# Patient Record
Sex: Female | Born: 1995 | Race: White | Hispanic: No | Marital: Single | Smoking: Never smoker
Health system: Southern US, Community
[De-identification: ages and names within clinical notes are randomized; demographics above are authoritative.]

## PROBLEM LIST (undated history)

## (undated) DIAGNOSIS — F909 Attention-deficit hyperactivity disorder, unspecified type: Secondary | ICD-10-CM

## (undated) DIAGNOSIS — N390 Urinary tract infection, site not specified: Secondary | ICD-10-CM

## (undated) HISTORY — DX: Urinary tract infection, site not specified: N39.0

## (undated) HISTORY — PX: CYSTOSCOPY: SUR368

## (undated) HISTORY — DX: Attention-deficit hyperactivity disorder, unspecified type: F90.9

---
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
## 2015-01-06 ENCOUNTER — Telehealth: Payer: Self-pay | Admitting: Family Medicine

## 2015-01-06 MED ORDER — AMPHETAMINE-DEXTROAMPHET ER 30 MG PO CP24: 30.0000 mg | ORAL_CAPSULE | Freq: Two times a day (BID) | ORAL | Status: DC

## 2015-01-06 NOTE — Telephone Encounter (Signed)
done

## 2015-01-06 NOTE — Telephone Encounter (Signed)
Refill request for Adderall XR, mom would like to pick up today.

## 2015-01-06 NOTE — Telephone Encounter (Signed)
Script is ready for pick up and I left a voice message on mom's cell number. 

## 2015-01-19 ENCOUNTER — Ambulatory Visit: Payer: 59 | Admitting: Family Medicine

## 2015-03-17 ENCOUNTER — Ambulatory Visit: Payer: Self-pay | Admitting: Medical

## 2015-03-17 ENCOUNTER — Telehealth: Payer: Self-pay

## 2015-03-17 NOTE — Telephone Encounter (Signed)
This patient no showed for their appointment today.Which of the following is necessary for this patient.   A) No follow-up necessary   B) Follow-up urgent. Locate Patient Immediately.   C) Follow-up necessary. Contact patient and Schedule visit in ____ Days.   D) Follow-up Advised. Contact patient and Schedule visit in ____ Days. 

## 2015-03-17 NOTE — Telephone Encounter (Signed)
No show new patient = no chance to come back

## 2015-03-19 ENCOUNTER — Ambulatory Visit: Payer: 59 | Admitting: Family Medicine

## 2015-03-19 ENCOUNTER — Telehealth: Payer: Self-pay | Admitting: Family Medicine

## 2015-03-19 NOTE — Telephone Encounter (Signed)
Pt was on schedule to see Dr. Sarajane Jews today, I called and left a voice message for pt to reschedule.

## 2015-04-05 ENCOUNTER — Telehealth: Payer: Self-pay | Admitting: Family Medicine

## 2015-04-05 MED ORDER — AMPHETAMINE-DEXTROAMPHET ER 30 MG PO CP24: 30.0000 mg | ORAL_CAPSULE | Freq: Two times a day (BID) | ORAL | Status: DC

## 2015-04-05 NOTE — Telephone Encounter (Signed)
Refill request for Adderall XR, pt will schedule a office visit for May, this is when she gets out of school. Jessica Lucero did apologize for last missed appointment, since she did the scheduling.

## 2015-04-05 NOTE — Telephone Encounter (Signed)
done

## 2015-04-06 NOTE — Telephone Encounter (Signed)
Rx is ready for pick up. Voicemail was left fr the pt

## 2015-07-01 ENCOUNTER — Telehealth: Payer: Self-pay | Admitting: Family Medicine

## 2015-07-01 MED ORDER — AMPHETAMINE-DEXTROAMPHET ER 30 MG PO CP24: 30.0000 mg | ORAL_CAPSULE | Freq: Two times a day (BID) | ORAL | Status: DC

## 2015-07-01 MED ORDER — SUMATRIPTAN SUCCINATE 100 MG PO TABS: ORAL_TABLET | ORAL | Status: DC

## 2015-07-01 NOTE — Telephone Encounter (Signed)
Refill request for Adderall & Imitrex, please call mom's cell number when ready.

## 2015-07-01 NOTE — Telephone Encounter (Signed)
Script for Adderall is ready for pick up, sent Imitrex e-scribe and left a voice message on mom's cell number.

## 2015-07-01 NOTE — Telephone Encounter (Signed)
Adderall was written. Also refill Imitrex for one year

## 2015-09-27 ENCOUNTER — Telehealth: Payer: Self-pay | Admitting: Family Medicine

## 2015-09-27 NOTE — Telephone Encounter (Signed)
Refill request for Adderall and call Nicole's (mom) cell number when ready.

## 2015-09-28 MED ORDER — AMPHETAMINE-DEXTROAMPHET ER 30 MG PO CP24: 30.0000 mg | ORAL_CAPSULE | Freq: Two times a day (BID) | ORAL | 0 refills | Status: DC

## 2015-09-28 NOTE — Telephone Encounter (Signed)
Written for 3 months, but she needs an OV as soon as she is available

## 2015-09-28 NOTE — Telephone Encounter (Signed)
Left message for patient to call back. Needs office visit as soon as she is available. Rx's are up front for pick up.

## 2015-09-28 NOTE — Telephone Encounter (Signed)
Left message on Mom's cell phone that Rx's are ready and when she comes by to please make appointment for patient to follow up on her meds.

## 2015-10-27 ENCOUNTER — Ambulatory Visit: Payer: 59 | Admitting: Family Medicine

## 2016-10-20 ENCOUNTER — Ambulatory Visit (INDEPENDENT_AMBULATORY_CARE_PROVIDER_SITE_OTHER): Payer: 59 | Admitting: Family Medicine

## 2016-10-20 ENCOUNTER — Encounter: Payer: Self-pay | Admitting: Family Medicine

## 2016-10-20 VITALS — BP 108/69 | HR 82 | Temp 98.8°F | Ht 63.0 in | Wt 155.0 lb

## 2016-10-20 DIAGNOSIS — L700 Acne vulgaris: Secondary | ICD-10-CM

## 2016-10-20 DIAGNOSIS — F900 Attention-deficit hyperactivity disorder, predominantly inattentive type: Secondary | ICD-10-CM

## 2016-10-20 DIAGNOSIS — N62 Hypertrophy of breast: Secondary | ICD-10-CM | POA: Diagnosis not present

## 2016-10-20 MED ORDER — ATOMOXETINE HCL 40 MG PO CAPS: 40.0000 mg | ORAL_CAPSULE | Freq: Every day | ORAL | 2 refills | Status: DC

## 2016-10-20 MED ORDER — TRETINOIN 0.05 % EX CREA: TOPICAL_CREAM | Freq: Every day | CUTANEOUS | 2 refills | Status: DC

## 2016-10-20 NOTE — Patient Instructions (Signed)
WE NOW OFFER   Jessica Lucero's FAST TRACK!!!  SAME DAY Appointments for ACUTE CARE  Such as: Sprains, Injuries, cuts, abrasions, rashes, muscle pain, joint pain, back pain Colds, flu, sore throats, headache, allergies, cough, fever  Ear pain, sinus and eye infections Abdominal pain, nausea, vomiting, diarrhea, upset stomach Animal/insect bites  3 Easy Ways to Schedule: Walk-In Scheduling Call in scheduling Mychart Sign-up: [email or web address]         

## 2016-10-20 NOTE — Progress Notes (Signed)
   Subjective:    Patient ID: Jessica Lucero, female    DOB: 1995-03-31, 21 y.o.   MRN: [id number]  HPI Here with her mother for several issues. First she is attending classes at a community college ans she is struggling with poor focus and with staying organized and getting work done on time. She had been taking Adderall for her ADHD and it was effective, but it made her feel nervous and she had trouble with sleeping while on this. She asks to try Strattera again, which she took for awhile as a young child. Second she has had trouble with facial acne this past 6 months despite keeping her face clean and using skin care products. Mother asks if she can try a retin A product. Third Shaine complains of large breasts. She developed large breasts at the age of 17 and she has always been bothered by them, both physically and psychologically. She has constant pains in the neck, chest, and upper back. Exercising is difficult because they get in the way. She asks about a possible surgical referral.    Review of Systems  Constitutional: Negative.   Respiratory: Negative.   Cardiovascular: Positive for chest pain. Negative for palpitations and leg swelling.  Gastrointestinal: Negative.   Musculoskeletal: Positive for neck pain and neck stiffness.  Neurological: Negative.   Psychiatric/Behavioral: Positive for decreased concentration. Negative for confusion and dysphoric mood. The patient is not nervous/anxious.        Objective:   Physical Exam  Constitutional: She is oriented to person, place, and time. She appears well-developed and well-nourished.  Neck: Normal range of motion. Neck supple. No thyromegaly present.  Cardiovascular: Normal rate, regular rhythm, normal heart sounds and intact distal pulses.   Pulmonary/Chest: Effort normal and breath sounds normal. No respiratory distress. She has no wheezes. She has no rales.  She does have fairly large breasts   Lymphadenopathy:    She has  no cervical adenopathy.  Neurological: She is alert and oriented to person, place, and time.  Skin:  Mild papular and pustular acne on the face           Assessment & Plan:  For the ADHD she will try Strattera at 40 mg every day. She will let me know in 3 weeks how this is working . For the acne she will try Retin A cream daily. For the large breasts, she has recently started on a diet and exercise program to lose weight. I encouraged her to do this, and I told her that losing weight may improve her breast situation all by itself. She will follow up prn.  Alysia Penna, MD

## 2016-10-23 DIAGNOSIS — N62 Hypertrophy of breast: Secondary | ICD-10-CM | POA: Insufficient documentation

## 2016-11-07 ENCOUNTER — Encounter: Payer: Self-pay | Admitting: Family Medicine

## 2016-11-07 NOTE — Telephone Encounter (Signed)
Let's double the dose to 80 mg daily. Call in #[phone] with 3 rf

## 2016-11-13 ENCOUNTER — Encounter: Payer: Self-pay | Admitting: Family Medicine

## 2016-11-13 NOTE — Telephone Encounter (Signed)
The new dose is 80 mg daily. She should still have 3 refills available at her pharmacy

## 2016-11-14 ENCOUNTER — Encounter: Payer: Self-pay | Admitting: Family Medicine

## 2016-11-14 NOTE — Telephone Encounter (Signed)
Last month I doubled her dose from 40 mg a day to 80 mg a day. She has refills for this available at her pharmacy so just keep taking this

## 2017-01-15 ENCOUNTER — Other Ambulatory Visit: Payer: Self-pay | Admitting: Family Medicine

## 2017-01-15 NOTE — Telephone Encounter (Signed)
Sent to PCP for approval.  

## 2017-04-19 ENCOUNTER — Other Ambulatory Visit: Payer: Self-pay | Admitting: Family Medicine

## 2017-04-20 NOTE — Telephone Encounter (Signed)
Last OV 10/20/2016   Last refilled 10/20/2016 disp 45 g with 2 refills   Sent to PCP for approval

## 2017-09-08 ENCOUNTER — Other Ambulatory Visit: Payer: Self-pay | Admitting: Family Medicine

## 2017-09-10 NOTE — Telephone Encounter (Signed)
Last OV 10/20/2016   Last refilled 01/15/2017 disp 90 with 3 refills   Sent to PCP to advise   Pt is due for an OV next month

## 2017-11-07 ENCOUNTER — Encounter: Payer: Self-pay | Admitting: Family Medicine

## 2017-11-07 ENCOUNTER — Ambulatory Visit: Payer: 59 | Admitting: Family Medicine

## 2017-11-07 VITALS — BP 130/90 | HR 85 | Temp 98.2°F | Wt 156.1 lb

## 2017-11-07 DIAGNOSIS — F900 Attention-deficit hyperactivity disorder, predominantly inattentive type: Secondary | ICD-10-CM

## 2017-11-07 DIAGNOSIS — L700 Acne vulgaris: Secondary | ICD-10-CM

## 2017-11-07 DIAGNOSIS — R5383 Other fatigue: Secondary | ICD-10-CM

## 2017-11-07 DIAGNOSIS — M791 Myalgia, unspecified site: Secondary | ICD-10-CM | POA: Diagnosis not present

## 2017-11-07 DIAGNOSIS — D1724 Benign lipomatous neoplasm of skin and subcutaneous tissue of left leg: Secondary | ICD-10-CM

## 2017-11-07 LAB — CBC WITH DIFFERENTIAL/PLATELET
BASOS PCT: 0.4 % (ref 0.0–3.0)
Basophils Absolute: 0 10*3/uL (ref 0.0–0.1)
EOS ABS: 0 10*3/uL (ref 0.0–0.7)
Eosinophils Relative: 0.5 % (ref 0.0–5.0)
HEMATOCRIT: 40.8 % (ref 36.0–46.0)
Hemoglobin: 14.1 g/dL (ref 12.0–15.0)
LYMPHS PCT: 38.5 % (ref 12.0–46.0)
Lymphs Abs: 3.3 10*3/uL (ref 0.7–4.0)
MCHC: 34.5 g/dL (ref 30.0–36.0)
MCV: 87.3 fl (ref 78.0–100.0)
MONOS PCT: 5.2 % (ref 3.0–12.0)
Monocytes Absolute: 0.4 10*3/uL (ref 0.1–1.0)
NEUTROS ABS: 4.7 10*3/uL (ref 1.4–7.7)
NEUTROS PCT: 55.4 % (ref 43.0–77.0)
PLATELETS: 278 10*3/uL (ref 150.0–400.0)
RBC: 4.67 Mil/uL (ref 3.87–5.11)
RDW: 12.5 % (ref 11.5–15.5)
WBC: 8.5 10*3/uL (ref 4.0–10.5)

## 2017-11-07 LAB — TSH: TSH: 2.66 u[IU]/mL (ref 0.35–4.50)

## 2017-11-07 LAB — HEPATIC FUNCTION PANEL
ALBUMIN: 4.7 g/dL (ref 3.5–5.2)
ALK PHOS: 38 U/L — AB (ref 39–117)
ALT: 10 U/L (ref 0–35)
AST: 15 U/L (ref 0–37)
Bilirubin, Direct: 0.1 mg/dL (ref 0.0–0.3)
Total Bilirubin: 0.6 mg/dL (ref 0.2–1.2)
Total Protein: 7.7 g/dL (ref 6.0–8.3)

## 2017-11-07 LAB — BASIC METABOLIC PANEL
BUN: 12 mg/dL (ref 6–23)
CHLORIDE: 102 meq/L (ref 96–112)
CO2: 26 meq/L (ref 19–32)
CREATININE: 0.72 mg/dL (ref 0.40–1.20)
Calcium: 9.8 mg/dL (ref 8.4–10.5)
GFR: 107.74 mL/min (ref >60.00)
Glucose, Bld: 91 mg/dL (ref 70–99)
Potassium: 3.6 mEq/L (ref 3.5–5.1)
Sodium: 136 mEq/L (ref 135–145)

## 2017-11-07 LAB — T4, FREE: Free T4: 0.79 ng/dL (ref 0.60–1.60)

## 2017-11-07 LAB — VITAMIN B12: Vitamin B-12: 301 pg/mL (ref 211–911)

## 2017-11-07 LAB — VITAMIN D 25 HYDROXY (VIT D DEFICIENCY, FRACTURES): VITD: 29.27 ng/mL — ABNORMAL LOW (ref 30.00–100.00)

## 2017-11-07 LAB — T3, FREE: T3, Free: 3.4 pg/mL (ref 2.3–4.2)

## 2017-11-07 LAB — FERRITIN: Ferritin: 30.2 ng/mL (ref 10.0–291.0)

## 2017-11-07 MED ORDER — TRETINOIN 0.1 % EX CREA: TOPICAL_CREAM | Freq: Every day | CUTANEOUS | 5 refills | Status: DC

## 2017-11-07 MED ORDER — SUMATRIPTAN SUCCINATE 100 MG PO TABS: ORAL_TABLET | ORAL | 11 refills | Status: DC

## 2017-11-07 MED ORDER — ATOMOXETINE HCL 60 MG PO CAPS: 60.0000 mg | ORAL_CAPSULE | Freq: Every day | ORAL | 5 refills | Status: DC

## 2017-11-07 MED ORDER — DICLOFENAC SODIUM 75 MG PO TBEC: 75.0000 mg | DELAYED_RELEASE_TABLET | Freq: Two times a day (BID) | ORAL | 5 refills | Status: DC

## 2017-11-07 NOTE — Progress Notes (Signed)
   Subjective:    Patient ID: Jessica Lucero, female    DOB: 1995/10/04, 22 y.o.   MRN: [id number]  HPI Here with her mother for several concerns. First she has felt very fatigued for the past 4 months and she is not sure why. She used to exercise 6-7 days a week but lately she has been so tired she stopped going to the gym. She sleeps well. No change in diet. She also describes a mild burning or aching pain in both legs, especially the thighs for 2 months. No swelling in the feet. Ibuprofen helps a little. She still uses Retin A cream on the face but she asks if the dose can be increased. Also she says the Strattera does not help her focus as well as it used to. Finally she noticed a lump on the left leg a week ago and asks me to check it. This does not bother her in any way.    Review of Systems  Constitutional: Positive for fatigue.  Respiratory: Negative.   Cardiovascular: Negative.   Musculoskeletal: Positive for myalgias.  Neurological: Negative.   Psychiatric/Behavioral: Positive for decreased concentration. Negative for dysphoric mood and sleep disturbance. The patient is nervous/anxious.        Objective:   Physical Exam  Constitutional: She is oriented to person, place, and time. She appears well-developed and well-nourished.  Cardiovascular: Normal rate, regular rhythm, normal heart sounds and intact distal pulses.  Pulmonary/Chest: Effort normal and breath sounds normal.  Abdominal: Soft. Bowel sounds are normal. She exhibits no distension and no mass. There is no tenderness. There is no rebound and no guarding.  Musculoskeletal: She exhibits no edema or tenderness.  Left lower lateral leg has a small firm mobile non-tender mass under the skin   Neurological: She is alert and oriented to person, place, and time.  Skin:  Mild papular acne on the face  Psychiatric: She has a normal mood and affect. Her behavior is normal. Thought content normal.          Assessment &  Plan:  First she has a lipoma on the leg. I reassured her this is benign and does not require treatment. I am not sure why she is having myalgias, but we will get labs today to check for anemia, etc. She can use Diclofenac as needed. For the fatigue we will get labs to check a thyroid panel, etc. I encouraged her to exercise at least 3 days a week. For the acne we will increase Retin A cream to 0.1% to apply daily. For the ADHD we will increase the Strattera to 60 mg daily.  Alysia Penna, MD

## 2018-02-05 ENCOUNTER — Ambulatory Visit: Payer: 59 | Admitting: Family Medicine

## 2018-02-05 ENCOUNTER — Encounter: Payer: Self-pay | Admitting: Family Medicine

## 2018-02-05 VITALS — BP 120/82 | HR 83 | Temp 98.5°F | Wt 161.1 lb

## 2018-02-05 DIAGNOSIS — F900 Attention-deficit hyperactivity disorder, predominantly inattentive type: Secondary | ICD-10-CM | POA: Diagnosis not present

## 2018-02-05 MED ORDER — LISDEXAMFETAMINE DIMESYLATE 30 MG PO CAPS: 30.0000 mg | ORAL_CAPSULE | Freq: Every day | ORAL | 0 refills | Status: DC

## 2018-02-05 NOTE — Progress Notes (Signed)
   Subjective:    Patient ID: Jessica Lucero, female    DOB: 03-22-1995, 23 y.o.   MRN: [id number]  HPI Here for several issues. First she is frustrated with her steady slow weight gain despite the fact that she works out for an hour 3 days a week. She watches her diet. However she has gained 5 lbs since last October. Also she is unhappy with Strattera. She is having trouble with her classes and with focus, and she feels tired all the time. She had good results with Adderall, but it bothered her sleep. She wants to try an alternative. She is very excited because she was recently accepted into the Education [employer] at [employer] for next fall. Of note we did a thorough lab workup including a thyroid panel last fall which was negative.    Review of Systems  Constitutional: Positive for unexpected weight change. Negative for activity change and appetite change.  Respiratory: Negative.   Cardiovascular: Negative.   Endocrine: Negative.   Neurological: Negative.   Psychiatric/Behavioral: Positive for decreased concentration. Negative for confusion and dysphoric mood. The patient is not nervous/anxious.        Objective:   Physical Exam Constitutional:      Appearance: Normal appearance.  Cardiovascular:     Rate and Rhythm: Normal rate and regular rhythm.     Pulses: Normal pulses.     Heart sounds: Normal heart sounds.  Pulmonary:     Effort: Pulmonary effort is normal.     Breath sounds: Normal breath sounds.  Lymphadenopathy:     Cervical: No cervical adenopathy.  Neurological:     General: No focal deficit present.     Mental Status: She is alert and oriented to person, place, and time.           Assessment & Plan:  For the weight gain, we will refer her to Nutrition to evaluate. For her ADHD, she will switch from Strattera to Vyvanse, and this may help with some appetite suppression. Hopefully the Vyvanse will help the ADHD better.  Alysia Penna, MD

## 2018-02-21 ENCOUNTER — Ambulatory Visit: Payer: 59 | Admitting: Registered"

## 2018-02-22 ENCOUNTER — Encounter: Payer: Self-pay | Admitting: Family Medicine

## 2018-02-22 MED ORDER — LISDEXAMFETAMINE DIMESYLATE 40 MG PO CAPS: 40.0000 mg | ORAL_CAPSULE | ORAL | 0 refills | Status: DC

## 2018-02-22 NOTE — Telephone Encounter (Signed)
I agree. I sent in a new rx for the next step up which is Vyvanse 40 mg daily

## 2018-02-22 NOTE — Telephone Encounter (Signed)
Dr. Fry please advise. Thanks  

## 2018-03-11 ENCOUNTER — Ambulatory Visit: Payer: 59 | Admitting: Registered"

## 2018-03-12 ENCOUNTER — Ambulatory Visit: Payer: 59 | Admitting: Nutrition

## 2018-03-28 ENCOUNTER — Other Ambulatory Visit: Payer: Self-pay | Admitting: Family Medicine

## 2018-03-29 MED ORDER — LISDEXAMFETAMINE DIMESYLATE 40 MG PO CAPS: 40.0000 mg | ORAL_CAPSULE | ORAL | 0 refills | Status: DC

## 2018-03-29 NOTE — Telephone Encounter (Signed)
Dr. Fry please advise. Thanks  

## 2018-03-29 NOTE — Telephone Encounter (Signed)
Done

## 2018-04-01 NOTE — Telephone Encounter (Signed)
Refills have already been sent to the pharmacy

## 2018-05-04 ENCOUNTER — Telehealth: Payer: 59 | Admitting: Gastroenterology

## 2018-05-04 DIAGNOSIS — J029 Acute pharyngitis, unspecified: Secondary | ICD-10-CM

## 2018-05-04 DIAGNOSIS — R05 Cough: Secondary | ICD-10-CM

## 2018-05-04 DIAGNOSIS — R059 Cough, unspecified: Secondary | ICD-10-CM

## 2018-05-04 MED ORDER — AMOXICILLIN 875 MG PO TABS: 875.0000 mg | ORAL_TABLET | Freq: Two times a day (BID) | ORAL | 0 refills | Status: DC

## 2018-05-04 NOTE — Addendum Note (Signed)
Addended by: Brunetta Jeans on: 05/04/2018 06:22 PM   Modules accepted: Orders

## 2018-05-04 NOTE — Progress Notes (Signed)
We are sorry you are not feeling well.  Here is how we plan to help!  Based on what you have shared with me, it looks like you may have a viral upper respiratory infection.  Upper respiratory infections are caused by a large number of viruses; however, rhinovirus is the most common cause.   Symptoms vary from person to person, with common symptoms including sore throat, cough, and fatigue or lack of energy.  A low-grade fever of up to 100.4 may present, but is often uncommon.  Symptoms vary however, and are closely related to a person's age or underlying illnesses.  The most common symptoms associated with an upper respiratory infection are nasal discharge or congestion, cough, sneezing, headache and pressure in the ears and face.  These symptoms usually persist for about 3 to 10 days, but can last up to 2 weeks.  It is important to know that upper respiratory infections do not cause serious illness or complications in most cases.    Upper respiratory infections can be transmitted from person to person, with the most common method of transmission being a person's hands.  The virus is able to live on the skin and can infect other persons for up to 2 hours after direct contact.  Also, these can be transmitted when someone coughs or sneezes; thus, it is important to cover the mouth to reduce this risk.  To keep the spread of the illness at [place], good hand hygiene is very important.  This is an infection that is most likely caused by a virus. There are no specific treatments other than to help you with the symptoms until the infection runs its course.  We are sorry you are not feeling well.  Here is how we plan to help!   For nasal congestion, you may use an oral decongestants such as Mucinex D or if you have glaucoma or high blood pressure use plain Mucinex.  Saline nasal spray or nasal drops can help and can safely be used as often as needed for congestion.    If you do not have a history of heart disease,  hypertension, diabetes or thyroid disease, prostate/bladder issues or glaucoma, you may also use Sudafed to treat nasal congestion.  It is highly recommended that you consult with a pharmacist or your primary care physician to ensure this medication is safe for you to take.     If you have a cough, you may use cough suppressants such as Delsym and Robitussin.  If you have glaucoma or high blood pressure, you can also use Coricidin HBP.     If you have a sore or scratchy throat, use a saltwater gargle-  to  teaspoon of salt dissolved in a 4-ounce to 8-ounce glass of warm water.  Gargle the solution for approximately 15-30 seconds and then spit.  It is important not to swallow the solution.  You can also use throat lozenges/cough drops and Chloraseptic spray to help with throat pain or discomfort.  Warm or cold liquids can also be helpful in relieving throat pain.  For headache, pain or general discomfort, you can use Ibuprofen or Tylenol as directed.   Some authorities believe that zinc sprays or the use of Echinacea may shorten the course of your symptoms.   HOME CARE . Only take medications as instructed by your medical team. . Be sure to drink plenty of fluids. Water is fine as well as fruit juices, sodas and electrolyte beverages. You may want to stay  away from caffeine or alcohol. If you are nauseated, try taking small sips of liquids. How do you know if you are getting enough fluid? Your urine should be a pale yellow or almost colorless. . Get rest. . Taking a steamy shower or using a humidifier may help nasal congestion and ease sore throat pain. You can place a towel over your head and breathe in the steam from hot water coming from a faucet. . Using a saline nasal spray works much the same way. . Cough drops, hard candies and sore throat lozenges may ease your cough. . Avoid close contacts especially the very young and the elderly . Cover your mouth if you cough or sneeze . Always  remember to wash your hands.   GET HELP RIGHT AWAY IF: . You develop worsening fever. . If your symptoms do not improve within 10 days . You develop yellow or green discharge from your nose over 3 days. . You have coughing fits . You develop a severe head ache or visual changes. . You develop shortness of breath, difficulty breathing or start having chest pain . Your symptoms persist after you have completed your treatment plan  MAKE SURE YOU   Understand these instructions.  Will watch your condition.  Will get help right away if you are not doing well or get worse.  Your e-visit answers were reviewed by a board certified advanced clinical practitioner to complete your personal care plan. Depending upon the condition, your plan could have included both over the counter or prescription medications. Please review your pharmacy choice. If there is a problem, you may call our nursing hot line at and have the prescription routed to another pharmacy. Your safety is important to [place]. If you have drug allergies check your prescription carefully.   You can use MyChart to ask questions about today's visit, request a non-urgent call back, or ask for a work or school excuse for 24 hours related to this e-Visit. If it has been greater than 24 hours you will need to follow up with your provider, or enter a new e-Visit to address those concerns. You will get an e-mail in the next two days asking about your experience.  I hope that your e-visit has been valuable and will speed your recovery. Thank you for using e-visits.

## 2018-07-08 ENCOUNTER — Other Ambulatory Visit: Payer: Self-pay

## 2018-07-08 ENCOUNTER — Ambulatory Visit (INDEPENDENT_AMBULATORY_CARE_PROVIDER_SITE_OTHER): Payer: 59 | Admitting: *Deleted

## 2018-07-08 DIAGNOSIS — Z23 Encounter for immunization: Secondary | ICD-10-CM

## 2018-07-08 NOTE — Progress Notes (Signed)
Patient her for vaccines for college. TD and Menveo administered with no reactions

## 2018-07-09 ENCOUNTER — Encounter: Payer: Self-pay | Admitting: Family Medicine

## 2018-07-09 DIAGNOSIS — L700 Acne vulgaris: Secondary | ICD-10-CM

## 2018-07-09 NOTE — Telephone Encounter (Signed)
Dr. Fry please advise. Thanks  

## 2018-07-10 NOTE — Telephone Encounter (Signed)
I did the referral 

## 2018-08-13 ENCOUNTER — Encounter: Payer: Self-pay | Admitting: Family Medicine

## 2018-08-17 ENCOUNTER — Telehealth: Payer: Self-pay | Admitting: Family Medicine

## 2018-08-19 NOTE — Telephone Encounter (Signed)
Last filled 05/28/2018 Last OV 02/05/2018  Ok to fill?

## 2018-08-20 MED ORDER — LISDEXAMFETAMINE DIMESYLATE 40 MG PO CAPS: 40.0000 mg | ORAL_CAPSULE | ORAL | 0 refills | Status: DC

## 2018-08-20 NOTE — Telephone Encounter (Signed)
I sent the refills to [facility]  

## 2018-09-10 NOTE — Telephone Encounter (Signed)
See request °

## 2018-09-10 NOTE — Telephone Encounter (Signed)
Patient requesting a call back regarding her lisdexamfetamine (VYVANSE) 40 MG capsule,. Patient in need of clarity regarding message below, patient would like a call back today if possible best # [phone]

## 2018-09-10 NOTE — Telephone Encounter (Signed)
Tried to call pt but no answer and mail box is full.  

## 2018-09-11 ENCOUNTER — Encounter: Payer: Self-pay | Admitting: Family Medicine

## 2018-09-11 NOTE — Telephone Encounter (Signed)
Spoke with the patient today via mychart. Please see patient message for further documentation.

## 2018-09-24 ENCOUNTER — Encounter: Payer: Self-pay | Admitting: Family Medicine

## 2018-11-30 ENCOUNTER — Other Ambulatory Visit: Payer: Self-pay | Admitting: Family Medicine

## 2018-12-01 NOTE — Telephone Encounter (Signed)
Please see patient rx refill request.

## 2018-12-03 MED ORDER — LISDEXAMFETAMINE DIMESYLATE 40 MG PO CAPS: 40.0000 mg | ORAL_CAPSULE | ORAL | 0 refills | Status: DC

## 2018-12-03 NOTE — Telephone Encounter (Signed)
Done

## 2019-01-11 ENCOUNTER — Encounter: Payer: Self-pay | Admitting: Family Medicine

## 2019-01-13 MED ORDER — SUMATRIPTAN SUCCINATE 100 MG PO TABS: ORAL_TABLET | ORAL | 11 refills | Status: DC

## 2019-01-13 MED ORDER — SUMATRIPTAN SUCCINATE 100 MG PO TABS: ORAL_TABLET | ORAL | 0 refills | Status: DC

## 2019-01-13 NOTE — Addendum Note (Signed)
Addended by: Rebecca Eaton on: 01/13/2019 10:56 AM   Modules accepted: Orders

## 2019-02-04 ENCOUNTER — Encounter: Payer: Self-pay | Admitting: Family Medicine

## 2019-02-04 ENCOUNTER — Ambulatory Visit: Payer: 59 | Admitting: Family Medicine

## 2019-02-04 ENCOUNTER — Other Ambulatory Visit: Payer: Self-pay

## 2019-02-04 VITALS — BP 110/62 | HR 83 | Temp 98.0°F | Wt 152.4 lb

## 2019-02-04 DIAGNOSIS — N62 Hypertrophy of breast: Secondary | ICD-10-CM | POA: Diagnosis not present

## 2019-02-04 DIAGNOSIS — M542 Cervicalgia: Secondary | ICD-10-CM | POA: Diagnosis not present

## 2019-02-04 DIAGNOSIS — F900 Attention-deficit hyperactivity disorder, predominantly inattentive type: Secondary | ICD-10-CM | POA: Diagnosis not present

## 2019-02-04 DIAGNOSIS — G8929 Other chronic pain: Secondary | ICD-10-CM | POA: Diagnosis not present

## 2019-02-04 MED ORDER — AMPHETAMINE-DEXTROAMPHETAMINE 10 MG PO TABS: ORAL_TABLET | ORAL | 0 refills | Status: DC

## 2019-02-04 MED ORDER — LISDEXAMFETAMINE DIMESYLATE 40 MG PO CAPS: 40.0000 mg | ORAL_CAPSULE | ORAL | 0 refills | Status: DC

## 2019-02-04 NOTE — Progress Notes (Signed)
   Subjective:    Patient ID: Jessica Lucero, female    DOB: [birth year], 24 y.o.   MRN: [id number]  HPI Here for 2 issues. First she asks advice about her ADHD. She has been taking Vyvanse for school for several years, and she likes how it helps her focus during the mornings. However she often feels a "crash" in there late afternoon when this wears off, and she struggles in the evenings to do work or study. Second she asks about getting breast reduction surgery. She says that ever since she was a teenager and she developed breasts, they have been too large and they constantly pull on her neck and shoulders. She has developed a chronic pain in the neck and shoulders, and she finds herself hunching forward all the time due to the weight of her breasts.    Review of Systems  Constitutional: Negative.   Respiratory: Negative.   Cardiovascular: Negative.   Musculoskeletal: Positive for neck pain and neck stiffness.  Neurological: Negative.   Psychiatric/Behavioral: Positive for decreased concentration. Negative for dysphoric mood. The patient is not nervous/anxious.        Objective:   Physical Exam Constitutional:      Appearance: Normal appearance.  Cardiovascular:     Rate and Rhythm: Normal rate and regular rhythm.     Pulses: Normal pulses.     Heart sounds: Normal heart sounds.  Pulmonary:     Effort: Pulmonary effort is normal.     Breath sounds: Normal breath sounds.     Comments: She has very large breasts  Neurological:     General: No focal deficit present.     Mental Status: She is alert and oriented to person, place, and time.           Assessment & Plan:  For the ADHD, she will stay on Vyvanse in the mornings, and we will add Adderall 10 mg to take in the afternoons if she needs to do any evening work or study. As for neck and shoulder pain from the large breasts, I agree she would be a good candidate for reduction surgery. She will contact her insurance company  to get the names of plastic surgeons in the area that are on her approved panel. Once we find one, we will refer her for a consultation. Alysia Penna, MD

## 2019-02-04 NOTE — Telephone Encounter (Signed)
Ask [name] to contact her insurance company and find out what other ADHD meds they will cover

## 2019-02-04 NOTE — Telephone Encounter (Signed)
Please advise. Is there an alternative medication we could send in?

## 2019-02-05 ENCOUNTER — Encounter: Payer: Self-pay | Admitting: Family Medicine

## 2019-02-05 MED ORDER — AMPHETAMINE-DEXTROAMPHET ER 20 MG PO CP24: 40.0000 mg | ORAL_CAPSULE | Freq: Every day | ORAL | 0 refills | Status: DC

## 2019-02-05 NOTE — Telephone Encounter (Signed)
Prescriptions have been canceled.

## 2019-02-05 NOTE — Telephone Encounter (Signed)
I sent in the new [facility]all XR to [facility]. Please call [facility] to cancel the orders they have for Adderall and Vyvanse

## 2019-02-08 ENCOUNTER — Other Ambulatory Visit: Payer: Self-pay | Admitting: Family Medicine

## 2019-02-10 MED ORDER — SUMATRIPTAN SUCCINATE 100 MG PO TABS: ORAL_TABLET | ORAL | 0 refills | Status: DC

## 2019-02-21 ENCOUNTER — Telehealth (INDEPENDENT_AMBULATORY_CARE_PROVIDER_SITE_OTHER): Payer: 59 | Admitting: Family Medicine

## 2019-02-21 ENCOUNTER — Other Ambulatory Visit: Payer: Self-pay

## 2019-02-21 DIAGNOSIS — N62 Hypertrophy of breast: Secondary | ICD-10-CM

## 2019-02-21 DIAGNOSIS — G8929 Other chronic pain: Secondary | ICD-10-CM | POA: Diagnosis not present

## 2019-02-21 DIAGNOSIS — M542 Cervicalgia: Secondary | ICD-10-CM

## 2019-02-21 MED ORDER — DICLOFENAC SODIUM 75 MG PO TBEC: DELAYED_RELEASE_TABLET | ORAL | 5 refills | Status: DC

## 2019-02-21 NOTE — Progress Notes (Signed)
Virtual Visit via Video Note  I connected with the patient on 02/21/19 at  3:15 PM EST by a video enabled telemedicine application and verified that I am speaking with the correct person using two identifiers.  Location patient: home Location provider:work or home office Persons participating in the virtual visit: patient, provider  I discussed the limitations of evaluation and management by telemedicine and the availability of in person appointments. The patient expressed understanding and agreed to proceed.   HPI: Here to follow up on chronic neck pain due to large breasts. Since our visit a few weeks ago she has done some research and she found a Psychiatric nurse in [place] that is covered by her insurance. The insurance representative said she would need to fail medications and PT before the company would consider covering breast reduction surgery. She wants top pursue this because her neck pain bothers her every day and it is getting worse over time. In addition both of her parents have had spinal issues as well.    ROS: See pertinent positives and negatives per HPI.  Past Medical History:  Diagnosis Date  . ADHD (attention deficit hyperactivity disorder)   . Frequent UTI     Past Surgical History:  Procedure Laterality Date  . CYSTOSCOPY      Family History  Problem Relation Age of Onset  . Depression Other   . Anxiety disorder Other      Current Outpatient Medications:  .  [START ON 04/05/2019] amphetamine-dextroamphetamine (ADDERALL XR) 20 MG 24 hr capsule, Take 2 capsules (40 mg total) by mouth daily., Disp: 60 capsule, Rfl: 0 .  diclofenac (VOLTAREN) 75 MG EC tablet, Take one pill twice daily as needed for neck pain, Disp: 60 tablet, Rfl: 5 .  JUNEL FE 1/20 1-20 MG-MCG tablet, TK 1 T PO QD, Disp: , Rfl: 2 .  SUMAtriptan (IMITREX) 100 MG tablet, TAKE 1 TABLET IF NEEDED FOR HEADACHE- MAY REPEAT IN 2 HOURS IF NEEDED, Disp: 9 tablet, Rfl: 0 .  tretinoin (RETIN-A) 0.1 %  cream, Apply topically at bedtime., Disp: 45 g, Rfl: 5  EXAM:  VITALS per patient if applicable:  GENERAL: alert, oriented, appears well and in no acute distress  HEENT: atraumatic, conjunttiva clear, no obvious abnormalities on inspection of external nose and ears  NECK: normal movements of the head and neck  LUNGS: on inspection no signs of respiratory distress, breathing rate appears normal, no obvious gross SOB, gasping or wheezing  CV: no obvious cyanosis  MS: moves all visible extremities without noticeable abnormality  PSYCH/NEURO: pleasant and cooperative, no obvious depression or anxiety, speech and thought processing grossly intact  ASSESSMENT AND PLAN: 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 She will try Diclofenac as needed for pain. She will come into our clinic next week for an exam and for Xrays of the cervical and thoracic spines. We anticipate sending her for PT as well. Once all this is done and documented, we can refer her to the plastic surgeon.  Alysia Penna, MD  Discussed the following assessment and plan:  Large breasts  Chronic neck pain     I discussed the assessment and treatment plan with the patient. The patient was provided an opportunity to ask questions and all were answered. The patient agreed with the plan and demonstrated an understanding of the instructions.   The patient was advised to call back or seek an in-person evaluation if the symptoms worsen or if the condition fails to improve as anticipated.

## 2019-02-25 ENCOUNTER — Other Ambulatory Visit: Payer: Self-pay

## 2019-02-26 ENCOUNTER — Ambulatory Visit (INDEPENDENT_AMBULATORY_CARE_PROVIDER_SITE_OTHER): Payer: 59

## 2019-02-26 ENCOUNTER — Telehealth: Payer: Self-pay

## 2019-02-26 ENCOUNTER — Ambulatory Visit: Payer: 59 | Admitting: Family Medicine

## 2019-02-26 ENCOUNTER — Encounter: Payer: Self-pay | Admitting: Family Medicine

## 2019-02-26 ENCOUNTER — Other Ambulatory Visit: Payer: 59

## 2019-02-26 VITALS — BP 120/60 | HR 94 | Temp 97.9°F | Wt 145.4 lb

## 2019-02-26 DIAGNOSIS — M542 Cervicalgia: Secondary | ICD-10-CM

## 2019-02-26 DIAGNOSIS — M546 Pain in thoracic spine: Secondary | ICD-10-CM

## 2019-02-26 DIAGNOSIS — G8929 Other chronic pain: Secondary | ICD-10-CM | POA: Diagnosis not present

## 2019-02-26 NOTE — Progress Notes (Signed)
   Subjective:    Patient ID: Jessica Lucero, female    DOB: [birth year], 24 y.o.   MRN: [id number]  HPI Here to discuss her chronic neck pain. She has had stiffness and pain in the neck, shoulders, and upper back for years now, and she often uses heat packs and takes Ibuprofen for this. She has learned that her large breasts are a major contributing factor, because when she tries to exercise she must wear several layers of heavy duty support bras to give her enough support to tolerate the pain. She is unable to run at all because of this. She wants to have a breast reduction surgery to help reduce the pain she endures every day.    Review of Systems  Constitutional: Negative.   Respiratory: Negative.   Cardiovascular: Negative.   Musculoskeletal: Positive for back pain, neck pain and neck stiffness.       Objective:   Physical Exam Constitutional:      Appearance: Normal appearance.  Cardiovascular:     Rate and Rhythm: Normal rate and regular rhythm.     Pulses: Normal pulses.     Heart sounds: Normal heart sounds.  Pulmonary:     Effort: Pulmonary effort is normal.     Breath sounds: Normal breath sounds.  Musculoskeletal:     Comments: Her neck has some spasm and tenderness with mildly reduced ROM. The upper trapezius muscles on both sides are tender. She is tender along the upper chest as well. Both breasts are large   Neurological:     Mental Status: She is alert.           Assessment & Plan:  Chronic neck pain. We will get Xrays of the cervical spine today. I feel the main factor causing her neck and upper back pain is constant pulling tension from her large breasts. I agree that a breast reduction surgery is indicated at this point. We will refer her to Plastic Surgery.  Alysia Penna, MD

## 2019-02-26 NOTE — Addendum Note (Signed)
Addended by: Alysia Penna A on: 02/26/2019 02:33 PM   Modules accepted: Orders

## 2019-02-26 NOTE — Telephone Encounter (Signed)
Left message for patient to call back. The patient is on the lab schedule for an Xray today. Per Dr. Sarajane Jews, he needs to see her in office as well. Lm for patient to call back and schedule appointment for today.   CRM created.

## 2019-02-26 NOTE — Addendum Note (Signed)
Addended by: Suzette Battiest on: 02/26/2019 02:33 PM   Modules accepted: Orders

## 2019-03-10 ENCOUNTER — Encounter: Payer: Self-pay | Admitting: Family Medicine

## 2019-03-10 DIAGNOSIS — N62 Hypertrophy of breast: Secondary | ICD-10-CM

## 2019-03-10 NOTE — Telephone Encounter (Signed)
Please advise. I do not see this order.

## 2019-03-13 NOTE — Telephone Encounter (Signed)
No I have not done the referral yet, and I cannot find the information now. Ask her to send [place] an email with the name and contact information of the surgeon again

## 2019-03-14 NOTE — Telephone Encounter (Signed)
I did the referral 

## 2019-04-02 ENCOUNTER — Encounter: Payer: Self-pay | Admitting: Family Medicine

## 2019-04-02 ENCOUNTER — Other Ambulatory Visit: Payer: Self-pay | Admitting: Family Medicine

## 2019-04-02 MED ORDER — AMPHETAMINE-DEXTROAMPHET ER 20 MG PO CP24: 40.0000 mg | ORAL_CAPSULE | Freq: Every day | ORAL | 0 refills | Status: DC

## 2019-04-02 NOTE — Telephone Encounter (Signed)
Done

## 2019-04-03 MED ORDER — SUMATRIPTAN SUCCINATE 100 MG PO TABS: ORAL_TABLET | ORAL | 0 refills | Status: DC

## 2019-04-24 ENCOUNTER — Encounter: Payer: Self-pay | Admitting: Family Medicine

## 2019-04-25 ENCOUNTER — Encounter: Payer: Self-pay | Admitting: Family Medicine

## 2019-04-28 ENCOUNTER — Encounter: Payer: Self-pay | Admitting: Family Medicine

## 2019-04-29 MED ORDER — LISDEXAMFETAMINE DIMESYLATE 50 MG PO CAPS: 50.0000 mg | ORAL_CAPSULE | Freq: Every morning | ORAL | 0 refills | Status: DC

## 2019-04-29 MED ORDER — AMPHETAMINE-DEXTROAMPHETAMINE 20 MG PO TABS: ORAL_TABLET | ORAL | 0 refills | Status: DC

## 2019-04-29 NOTE — Telephone Encounter (Signed)
I sent these to the [facility]

## 2019-05-17 ENCOUNTER — Other Ambulatory Visit: Payer: Self-pay | Admitting: Family Medicine

## 2019-05-19 MED ORDER — SUMATRIPTAN SUCCINATE 100 MG PO TABS: ORAL_TABLET | ORAL | 0 refills | Status: DC

## 2019-06-10 ENCOUNTER — Other Ambulatory Visit: Payer: Self-pay | Admitting: Family Medicine

## 2019-06-10 ENCOUNTER — Encounter: Payer: Self-pay | Admitting: Family Medicine

## 2019-06-10 MED ORDER — AMPHETAMINE-DEXTROAMPHETAMINE 20 MG PO TABS: ORAL_TABLET | ORAL | 0 refills | Status: DC

## 2019-06-10 MED ORDER — LISDEXAMFETAMINE DIMESYLATE 50 MG PO CAPS
50.0000 mg | ORAL_CAPSULE | Freq: Every morning | ORAL | 0 refills | Status: DC
Start: 2019-07-11 — End: 2019-06-10

## 2019-06-10 MED ORDER — LISDEXAMFETAMINE DIMESYLATE 50 MG PO CAPS
50.0000 mg | ORAL_CAPSULE | Freq: Every morning | ORAL | 0 refills | Status: DC
Start: 2019-08-10 — End: 2019-08-29

## 2019-06-10 MED ORDER — LISDEXAMFETAMINE DIMESYLATE 50 MG PO CAPS: 50.0000 mg | ORAL_CAPSULE | Freq: Every morning | ORAL | 0 refills | Status: DC

## 2019-06-10 NOTE — Telephone Encounter (Signed)
Forwarding to PCP for approval  

## 2019-07-11 ENCOUNTER — Encounter: Payer: Self-pay | Admitting: Family Medicine

## 2019-08-29 ENCOUNTER — Other Ambulatory Visit: Payer: Self-pay | Admitting: Family Medicine

## 2019-08-29 NOTE — Telephone Encounter (Signed)
Last filled 08/10/2019 Last filled 08/10/2019  Last OV 02/26/2019 Ok to fill?

## 2019-09-02 MED ORDER — LISDEXAMFETAMINE DIMESYLATE 50 MG PO CAPS: 50.0000 mg | ORAL_CAPSULE | Freq: Every morning | ORAL | 0 refills | Status: DC

## 2019-09-02 MED ORDER — AMPHETAMINE-DEXTROAMPHETAMINE 20 MG PO TABS: ORAL_TABLET | ORAL | 0 refills | Status: DC

## 2019-09-02 MED ORDER — AMPHETAMINE-DEXTROAMPHETAMINE 20 MG PO TABS: 20.0000 mg | ORAL_TABLET | Freq: Every day | ORAL | 0 refills | Status: DC

## 2019-09-02 NOTE — Telephone Encounter (Signed)
Done

## 2019-09-29 ENCOUNTER — Ambulatory Visit: Payer: 59 | Admitting: Family Medicine

## 2019-09-29 DIAGNOSIS — Z0289 Encounter for other administrative examinations: Secondary | ICD-10-CM

## 2019-10-10 ENCOUNTER — Other Ambulatory Visit: Payer: Self-pay

## 2019-10-13 ENCOUNTER — Encounter: Payer: Self-pay | Admitting: Family Medicine

## 2019-10-13 ENCOUNTER — Ambulatory Visit (INDEPENDENT_AMBULATORY_CARE_PROVIDER_SITE_OTHER): Payer: 59 | Admitting: Family Medicine

## 2019-10-13 VITALS — BP 120/70 | HR 82 | Temp 99.0°F | Wt 140.6 lb

## 2019-10-13 DIAGNOSIS — F418 Other specified anxiety disorders: Secondary | ICD-10-CM | POA: Diagnosis not present

## 2019-10-13 MED ORDER — ESCITALOPRAM OXALATE 10 MG PO TABS
10.0000 mg | ORAL_TABLET | Freq: Every day | ORAL | 2 refills | Status: DC
Start: 2019-10-13 — End: 2019-11-06

## 2019-10-13 NOTE — Progress Notes (Signed)
   Subjective:    Patient ID: Jessica Lucero, female    DOB: 02-Feb-1995, 24 y.o.   MRN: [id number]  HPI Here to discuss anxiety and depression. She has been under a lot of stress with finishing up college classes, applying to law schools, and the pressure of trying to make straight A's all the time. She says her parents simply think she is not working hard enough this semester because her grades have fallen a bit, and she admits to not doing her work on time and skipping classes. She says she feels overwhelmed and paralyzed at times and she says often does not have the energy to get out of bed. Or she may get ready for class and get in her car to leave, but instead she simply sits in her car and cries. She feels sad and hopeless at times, but she denies any suicidal thoughts. She also feels anxious and she worries about school and letting her parents down. Her ADHD has been well controlled with Vyvanse, but she does not take it every day. She sleeps well and her appetite is intact.    Review of Systems  Constitutional: Positive for fatigue.  Respiratory: Negative.   Cardiovascular: Negative.   Gastrointestinal: Negative.   Genitourinary: Negative.   Neurological: Negative.   Psychiatric/Behavioral: Positive for decreased concentration and dysphoric mood. Negative for agitation, behavioral problems, confusion, hallucinations, self-injury, sleep disturbance and suicidal ideas. The patient is nervous/anxious.        Objective:   Physical Exam Constitutional:      Appearance: Normal appearance.  Cardiovascular:     Rate and Rhythm: Normal rate and regular rhythm.     Pulses: Normal pulses.     Heart sounds: Normal heart sounds.  Pulmonary:     Effort: Pulmonary effort is normal.     Breath sounds: Normal breath sounds.  Neurological:     Mental Status: She is alert.  Psychiatric:        Behavior: Behavior normal.        Thought Content: Thought content normal.        Judgment:  Judgment normal.     Comments: She is anxious            Assessment & Plan:  Depression with anxiety, we will start her on Lexapro 10 mg daily. She plans to start seeing a therapist as well, and I encouraged her to do so. We will follow up in 3 weeks. We spent a total of 35 minutes today discussing these issues.  Alysia Penna, MD

## 2019-11-04 ENCOUNTER — Other Ambulatory Visit: Payer: Self-pay | Admitting: Family Medicine

## 2019-12-11 ENCOUNTER — Other Ambulatory Visit: Payer: Self-pay | Admitting: Family Medicine

## 2019-12-12 MED ORDER — AMPHETAMINE-DEXTROAMPHETAMINE 20 MG PO TABS: 20.0000 mg | ORAL_TABLET | Freq: Every day | ORAL | 0 refills | Status: DC

## 2019-12-12 MED ORDER — LISDEXAMFETAMINE DIMESYLATE 50 MG PO CAPS: 50.0000 mg | ORAL_CAPSULE | Freq: Every morning | ORAL | 0 refills | Status: DC

## 2019-12-12 NOTE — Telephone Encounter (Signed)
Done

## 2020-01-01 ENCOUNTER — Encounter: Payer: Self-pay | Admitting: Family Medicine

## 2020-01-05 MED ORDER — AMPHETAMINE-DEXTROAMPHETAMINE 20 MG PO TABS: 20.0000 mg | ORAL_TABLET | Freq: Every day | ORAL | 0 refills | Status: DC

## 2020-01-05 NOTE — Telephone Encounter (Signed)
It sounds like the only medication she is out of is the Adderall, so I just sent in for a new 30 day prescription of that.

## 2020-01-15 ENCOUNTER — Other Ambulatory Visit: Payer: Self-pay | Admitting: Family Medicine

## 2020-01-21 ENCOUNTER — Telehealth (INDEPENDENT_AMBULATORY_CARE_PROVIDER_SITE_OTHER): Payer: 59 | Admitting: Family Medicine

## 2020-01-21 ENCOUNTER — Encounter: Payer: Self-pay | Admitting: Family Medicine

## 2020-01-21 VITALS — Ht 62.99 in | Wt 139.0 lb

## 2020-01-21 DIAGNOSIS — F418 Other specified anxiety disorders: Secondary | ICD-10-CM | POA: Diagnosis not present

## 2020-01-21 MED ORDER — AMPHETAMINE-DEXTROAMPHETAMINE 20 MG PO TABS: 20.0000 mg | ORAL_TABLET | Freq: Every day | ORAL | 0 refills | Status: DC

## 2020-01-21 NOTE — Progress Notes (Signed)
Subjective:    Patient ID: Jessica Lucero, female    DOB: 08-28-1995, 24 y.o.   MRN: [id number]  HPI Virtual Visit via Video Note  I connected with the patient on 01/21/20 at  3:45 PM EST by a video enabled telemedicine application and verified that I am speaking with the correct person using two identifiers.  Location patient: home Location provider:work or home office Persons participating in the virtual visit: patient, provider  I discussed the limitations of evaluation and management by telemedicine and the availability of in person appointments. The patient expressed understanding and agreed to proceed.   HPI: Here asking for help to obtain an emotional support animal (or ESA). She has been dealing with depression and anxiety for several years, but she came to [place] for help 4 months ago. She is taking Lexapro and this has been helpful. She has now determined that an ESA would be of great benefit to her. She has already picked out a miniature golden doodle puppy which she hopes to purchase in the next few weeks. The problem is her current landlord does not allow pets on the property, and she asks me to draft a letter of medical necessity. She has also been trying to find a therapist to talk to, and she hopes to begin this in January.    ROS: See pertinent positives and negatives per HPI.  Past Medical History:  Diagnosis Date  . ADHD (attention deficit hyperactivity disorder)   . Frequent UTI     Past Surgical History:  Procedure Laterality Date  . CYSTOSCOPY      Family History  Problem Relation Age of Onset  . Depression Other   . Anxiety disorder Other      Current Outpatient Medications:  .  [START ON 04/03/2020] amphetamine-dextroamphetamine (ADDERALL) 20 MG tablet, Take 1 tablet (20 mg total) by mouth daily. One in the afternoon as needed, Disp: 30 tablet, Rfl: 0 .  escitalopram (LEXAPRO) 10 MG tablet, Take 1 tablet (10 mg total) by mouth daily., Disp: 90 tablet,  Rfl: 1 .  JUNEL FE 1/20 1-20 MG-MCG tablet, TK 1 T PO QD, Disp: , Rfl: 2 .  [START ON 02/11/2020] lisdexamfetamine (VYVANSE) 50 MG capsule, Take 1 capsule (50 mg total) by mouth in the morning., Disp: 30 capsule, Rfl: 0 .  SUMAtriptan (IMITREX) 100 MG tablet, TAKE 1 TABLET IF NEEDED FOR HEADACHE- MAY REPEAT IN 2 HOURS IF NEEDED, Disp: 9 tablet, Rfl: 0 .  tretinoin (RETIN-A) 0.1 % cream, Apply topically at bedtime. (Patient not taking: Reported on 01/21/2020), Disp: 45 g, Rfl: 5  EXAM:  VITALS per patient if applicable:  GENERAL: alert, oriented, appears well and in no acute distress  HEENT: atraumatic, conjunttiva clear, no obvious abnormalities on inspection of external nose and ears  NECK: normal movements of the head and neck  LUNGS: on inspection no signs of respiratory distress, breathing rate appears normal, no obvious gross SOB, gasping or wheezing  CV: no obvious cyanosis  MS: moves all visible extremities without noticeable abnormality  PSYCH/NEURO: pleasant and cooperative, no obvious depression or anxiety, speech and thought processing grossly intact  ASSESSMENT AND PLAN: Depression with anxiety. She will stay on Lexapro and hopefully she can start therapy soon. I fully support her getting an emotional support animal, and we will draft a letter to document this for her landlord. Follow as scheduled.  Alysia Penna, MD  Discussed the following assessment and plan:  Depression with anxiety  I discussed the assessment and treatment plan with the patient. The patient was provided an opportunity to ask questions and all were answered. The patient agreed with the plan and demonstrated an understanding of the instructions.   The patient was advised to call back or seek an in-person evaluation if the symptoms worsen or if the condition fails to improve as anticipated.     Review of Systems     Objective:   Physical Exam        Assessment & Plan:

## 2020-01-21 NOTE — Telephone Encounter (Signed)
Done

## 2020-01-27 NOTE — Telephone Encounter (Signed)
I already drafted such a letter and I believe it satisfies all these requirements

## 2020-02-02 ENCOUNTER — Encounter: Payer: Self-pay | Admitting: Family Medicine

## 2020-02-03 NOTE — Telephone Encounter (Signed)
I signed the letter and it is ready to be picked up or faxed

## 2020-02-15 ENCOUNTER — Other Ambulatory Visit: Payer: Self-pay | Admitting: Family Medicine

## 2020-02-16 MED ORDER — SUMATRIPTAN SUCCINATE 100 MG PO TABS
ORAL_TABLET | ORAL | 0 refills | Status: DC
Start: 2020-02-16 — End: 2020-07-03

## 2020-02-25 ENCOUNTER — Encounter: Payer: Self-pay | Admitting: Family Medicine

## 2020-02-26 ENCOUNTER — Other Ambulatory Visit: Payer: Self-pay

## 2020-02-26 DIAGNOSIS — F418 Other specified anxiety disorders: Secondary | ICD-10-CM

## 2020-02-26 MED ORDER — BUPROPION HCL ER (XL) 150 MG PO TB24: 150.0000 mg | ORAL_TABLET | Freq: Every day | ORAL | 2 refills | Status: DC

## 2020-02-26 NOTE — Telephone Encounter (Signed)
Tell to stop the Lexapro and we will try Wellbutrin XL 150 mg daily instead. Call in #[phone] with 2 rf. Give me a report back in 3-4 weeks

## 2020-03-08 ENCOUNTER — Ambulatory Visit: Payer: Self-pay | Admitting: Psychology

## 2020-03-19 ENCOUNTER — Other Ambulatory Visit: Payer: Self-pay | Admitting: Family Medicine

## 2020-03-19 DIAGNOSIS — F418 Other specified anxiety disorders: Secondary | ICD-10-CM

## 2020-03-19 MED ORDER — LISDEXAMFETAMINE DIMESYLATE 50 MG PO CAPS: 50.0000 mg | ORAL_CAPSULE | Freq: Every morning | ORAL | 0 refills | Status: DC

## 2020-03-19 NOTE — Telephone Encounter (Signed)
Requesting: vyvanse Contract:n/a UDS:n/a Last Visit:10/13/2019 Next Visit:n/a Last Refill:02/11/2020  Please Advise

## 2020-03-19 NOTE — Telephone Encounter (Signed)
Done

## 2020-04-27 ENCOUNTER — Encounter: Payer: Self-pay | Admitting: Family Medicine

## 2020-04-28 MED ORDER — AMPHETAMINE-DEXTROAMPHETAMINE 20 MG PO TABS: 20.0000 mg | ORAL_TABLET | Freq: Every day | ORAL | 0 refills | Status: DC

## 2020-04-28 MED ORDER — LISDEXAMFETAMINE DIMESYLATE 50 MG PO CAPS: 50.0000 mg | ORAL_CAPSULE | Freq: Every morning | ORAL | 0 refills | Status: DC

## 2020-04-28 NOTE — Telephone Encounter (Signed)
Spoke with pt aware that both Rx was sent to the [facility] as requested

## 2020-04-28 NOTE — Telephone Encounter (Signed)
These were sent in  

## 2020-05-11 ENCOUNTER — Other Ambulatory Visit: Payer: Self-pay | Admitting: Family Medicine

## 2020-05-27 ENCOUNTER — Other Ambulatory Visit: Payer: Self-pay | Admitting: Family Medicine

## 2020-05-27 NOTE — Telephone Encounter (Signed)
Last video visit- 01/21/2020

## 2020-05-28 MED ORDER — LISDEXAMFETAMINE DIMESYLATE 50 MG PO CAPS: 50.0000 mg | ORAL_CAPSULE | Freq: Every morning | ORAL | 0 refills | Status: DC

## 2020-05-28 MED ORDER — AMPHETAMINE-DEXTROAMPHETAMINE 20 MG PO TABS: 20.0000 mg | ORAL_TABLET | Freq: Every day | ORAL | 0 refills | Status: DC

## 2020-05-28 NOTE — Telephone Encounter (Signed)
Done

## 2020-07-03 ENCOUNTER — Other Ambulatory Visit: Payer: Self-pay | Admitting: Family Medicine

## 2020-07-05 MED ORDER — SUMATRIPTAN SUCCINATE 100 MG PO TABS: ORAL_TABLET | ORAL | 0 refills | Status: DC

## 2020-07-14 ENCOUNTER — Encounter: Payer: Self-pay | Admitting: Family Medicine

## 2020-07-15 MED ORDER — JUNEL FE 1/20 1-20 MG-MCG PO TABS: 1.0000 | ORAL_TABLET | Freq: Every day | ORAL | 5 refills | Status: DC

## 2020-07-15 NOTE — Telephone Encounter (Signed)
I sent in a 6 month supply

## 2020-08-20 ENCOUNTER — Encounter: Payer: Self-pay | Admitting: Family Medicine

## 2020-08-20 ENCOUNTER — Other Ambulatory Visit: Payer: Self-pay

## 2020-08-20 ENCOUNTER — Ambulatory Visit: Payer: No Typology Code available for payment source | Admitting: Family Medicine

## 2020-08-20 VITALS — BP 120/76 | HR 93 | Temp 98.5°F | Ht 62.99 in | Wt 137.6 lb

## 2020-08-20 DIAGNOSIS — F900 Attention-deficit hyperactivity disorder, predominantly inattentive type: Secondary | ICD-10-CM

## 2020-08-20 DIAGNOSIS — F418 Other specified anxiety disorders: Secondary | ICD-10-CM

## 2020-08-20 MED ORDER — LISDEXAMFETAMINE DIMESYLATE 60 MG PO CAPS: 60.0000 mg | ORAL_CAPSULE | ORAL | 0 refills | Status: DC

## 2020-08-20 MED ORDER — AMPHETAMINE-DEXTROAMPHETAMINE 20 MG PO TABS: 20.0000 mg | ORAL_TABLET | Freq: Every day | ORAL | 0 refills | Status: DC

## 2020-08-20 MED ORDER — BUSPIRONE HCL 10 MG PO TABS: 10.0000 mg | ORAL_TABLET | Freq: Two times a day (BID) | ORAL | 5 refills | Status: DC

## 2020-08-20 MED ORDER — BUPROPION HCL ER (XL) 150 MG PO TB24: 150.0000 mg | ORAL_TABLET | Freq: Every day | ORAL | 3 refills | Status: DC

## 2020-08-20 MED ORDER — LISDEXAMFETAMINE DIMESYLATE 60 MG PO CAPS
60.0000 mg | ORAL_CAPSULE | ORAL | 0 refills | Status: DC
Start: 2020-09-20 — End: 2020-08-20

## 2020-08-20 MED ORDER — LISDEXAMFETAMINE DIMESYLATE 60 MG PO CAPS
60.0000 mg | ORAL_CAPSULE | ORAL | 0 refills | Status: DC
Start: 2020-10-21 — End: 2020-10-20

## 2020-08-20 NOTE — Progress Notes (Signed)
   Subjective:    Patient ID: Jessica Lucero, female    DOB: 10-May-1995, 25 y.o.   MRN: [id number]  HPI Here to follow up on depression, anxiety, and ADHD. She has been struggling a bit this summer because there is only one more semester of college left, and she is not sure what she will be doing after she graduates. She is currently taking 2 summer courses online. She is living with her boyfriend this summer, and this has been stressful. Her focus has been a problem so she asks to increase the dose of Vyvanse if possible. She is pleased with this and she has no side effects. She has been taking Wellbutrin XL 150 mg daily for some time, and she is pleased with how this helps her depression symptoms. However her anxiety has been getting worse. She worries about everything and she becomes very obsessive compulsive about things. She describes getting angry when her boyfriend doesn't put up his dirty clothes or if the pillows on the couch aren't arranged the way she wants them to be. She recalls that Lexapro helped with her anxiety in the past but she had to stop it due to GI side effects.    Review of Systems  Constitutional: Negative.   Respiratory: Negative.    Cardiovascular: Negative.   Psychiatric/Behavioral:  Positive for agitation, behavioral problems and decreased concentration. Negative for confusion, dysphoric mood, hallucinations and sleep disturbance. The patient is nervous/anxious.       Objective:   Physical Exam Constitutional:      Appearance: Normal appearance.  Cardiovascular:     Rate and Rhythm: Normal rate and regular rhythm.     Pulses: Normal pulses.     Heart sounds: Normal heart sounds.  Pulmonary:     Effort: Pulmonary effort is normal.     Breath sounds: Normal breath sounds.  Neurological:     General: No focal deficit present.     Mental Status: She is alert and oriented to person, place, and time.  Psychiatric:        Behavior: Behavior normal.         Thought Content: Thought content normal.     Comments: She is anxious today           Assessment & Plan:  For the ADHD, we will increase the Vyvanse to 60 mg every morning. We will keep the prn Adderall in the eveings as it is. We will stay on Wellbutrin XL, but we will try Buspar 10 mg BID. Hopefully this can help with the anxiety and with the OC symptoms she describes. She will report back in 3-4 weeks. We spent 35 minutes reviewing records and discussing these issues.  Alysia Penna, MD

## 2020-09-11 ENCOUNTER — Other Ambulatory Visit: Payer: Self-pay | Admitting: Family Medicine

## 2020-09-13 NOTE — Telephone Encounter (Signed)
Ok for 90 day supply.  

## 2020-09-15 ENCOUNTER — Encounter: Payer: Self-pay | Admitting: Family Medicine

## 2020-09-20 MED ORDER — BUSPIRONE HCL 15 MG PO TABS: 15.0000 mg | ORAL_TABLET | Freq: Two times a day (BID) | ORAL | 1 refills | Status: DC

## 2020-09-20 NOTE — Telephone Encounter (Signed)
I increased the Buspirone to 15 mg BID and sent this in today. Also her Wellbutrin and Vyvanse are already dated for today, so she can get all 3 today

## 2020-10-15 ENCOUNTER — Telehealth: Payer: Self-pay | Admitting: Family Medicine

## 2020-10-15 NOTE — Telephone Encounter (Signed)
Lvm for patient to call office back.

## 2020-10-15 NOTE — Telephone Encounter (Signed)
PT called requesting to speak to Dr.Fry nurse in regards to medications and would like a callback.

## 2020-10-18 ENCOUNTER — Telehealth: Payer: Self-pay | Admitting: Family Medicine

## 2020-10-18 NOTE — Telephone Encounter (Signed)
Patient called back for lab results  Good callback number is [phone]

## 2020-10-19 ENCOUNTER — Encounter: Payer: Self-pay | Admitting: Family Medicine

## 2020-10-19 NOTE — Telephone Encounter (Signed)
Pt message was sent to Dr Sarajane Jews for advise

## 2020-10-19 NOTE — Telephone Encounter (Signed)
Spoke with pt verbalized understanding of lab results

## 2020-10-19 NOTE — Telephone Encounter (Signed)
Please advise 

## 2020-10-20 MED ORDER — AMPHETAMINE-DEXTROAMPHETAMINE 20 MG PO TABS: 20.0000 mg | ORAL_TABLET | Freq: Every day | ORAL | 0 refills | Status: DC

## 2020-10-20 MED ORDER — LISDEXAMFETAMINE DIMESYLATE 60 MG PO CAPS: 60.0000 mg | ORAL_CAPSULE | ORAL | 0 refills | Status: DC

## 2020-10-20 MED ORDER — LISDEXAMFETAMINE DIMESYLATE 60 MG PO CAPS
60.0000 mg | ORAL_CAPSULE | ORAL | 0 refills | Status: DC
Start: 2020-12-20 — End: 2020-12-15

## 2020-10-20 MED ORDER — LISDEXAMFETAMINE DIMESYLATE 60 MG PO CAPS
60.0000 mg | ORAL_CAPSULE | ORAL | 0 refills | Status: DC
Start: 2020-10-20 — End: 2020-10-20

## 2020-10-20 NOTE — Telephone Encounter (Signed)
I just sent in refills for 3 months (starting today) [facility] [place]. [facility] [place] [facility] any remaining refills there

## 2020-11-14 ENCOUNTER — Other Ambulatory Visit: Payer: Self-pay | Admitting: Family Medicine

## 2020-12-14 ENCOUNTER — Ambulatory Visit: Payer: No Typology Code available for payment source | Admitting: Family Medicine

## 2020-12-14 ENCOUNTER — Encounter: Payer: Self-pay | Admitting: Family Medicine

## 2020-12-14 VITALS — BP 122/78 | HR 84 | Temp 98.7°F | Wt 139.1 lb

## 2020-12-14 DIAGNOSIS — F418 Other specified anxiety disorders: Secondary | ICD-10-CM

## 2020-12-14 DIAGNOSIS — F900 Attention-deficit hyperactivity disorder, predominantly inattentive type: Secondary | ICD-10-CM | POA: Diagnosis not present

## 2020-12-14 NOTE — Progress Notes (Addendum)
   Subjective:    Patient ID: Jessica Lucero, female    DOB: 1995/10/20, 25 y.o.   MRN: [id number]  HPI Here to follow up on depression, anxiety, and ADHD. She is back living at home and taking on line courses for college. She has been struggling with anxiety and with beoing able to get things done. She is behind in her school work and her grades have been dropping. She is considering taking a medical leave of absence from school. 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 She asks to increase the Vyvanse if possible. She says the addition  of Buspar to the Wellbutrin has helped, but her anxiety is still a problem. She is wary about getting too much medication, however.    Review of Systems  Constitutional: Negative.   Respiratory: Negative.    Cardiovascular: Negative.   Psychiatric/Behavioral:  Positive for decreased concentration. Negative for agitation, behavioral problems, confusion, dysphoric mood, hallucinations, self-injury, sleep disturbance and suicidal ideas. The patient is nervous/anxious.       Objective:   Physical Exam Constitutional:      Appearance: Normal appearance.  Cardiovascular:     Rate and Rhythm: Normal rate and regular rhythm.     Pulses: Normal pulses.     Heart sounds: Normal heart sounds.  Pulmonary:     Effort: Pulmonary effort is normal.     Breath sounds: Normal breath sounds.  Neurological:     General: No focal deficit present.     Mental Status: She is alert and oriented to person, place, and time.  Psychiatric:        Behavior: Behavior normal.        Thought Content: Thought content normal.     Comments: Anxious and tearful           Assessment & Plan:  She is having a rough time now, and I told her I would support her taking a medical leave of absence from college if she decides to do so. We will supply any needed paperwork in that case. She says she is still thinking about  this. She says her anxiety has been much more of a problem than depression lately, so we agreed to stop the Wellbutrin and to increase the Buspar to 20 mg BID. Also she said she had talked to a school conselor twice but found this to be not helpful. Now that she is living at home, we will arrange for her to meet with one of our therapists virtually. For the ADHD we will increase the Vyvanse to 70 mg daily. Recheck with me in 3-4 weeks. We spent a total of ( 35  ) minutes reviewing records and discussing these issues.  Alysia Penna, MD

## 2020-12-15 ENCOUNTER — Other Ambulatory Visit: Payer: Self-pay | Admitting: Family Medicine

## 2020-12-15 ENCOUNTER — Encounter: Payer: Self-pay | Admitting: Family Medicine

## 2020-12-15 MED ORDER — LISDEXAMFETAMINE DIMESYLATE 70 MG PO CAPS: 70.0000 mg | ORAL_CAPSULE | Freq: Every day | ORAL | 0 refills | Status: DC

## 2020-12-15 MED ORDER — BUSPIRONE HCL 10 MG PO TABS: 20.0000 mg | ORAL_TABLET | Freq: Two times a day (BID) | ORAL | 5 refills | Status: DC

## 2020-12-15 NOTE — Telephone Encounter (Signed)
I sent in the new prescriptions for Vyvanse 70 mg daily and Buspar a total of 20 mg BID. I am still trying to get in touch with the therapist, but we will let her know when this happens.

## 2020-12-17 MED ORDER — SUMATRIPTAN SUCCINATE 100 MG PO TABS: ORAL_TABLET | ORAL | 0 refills | Status: DC

## 2020-12-30 ENCOUNTER — Encounter: Payer: Self-pay | Admitting: Family Medicine

## 2020-12-31 MED ORDER — AMPHETAMINE-DEXTROAMPHETAMINE 20 MG PO TABS: 20.0000 mg | ORAL_TABLET | Freq: Every day | ORAL | 0 refills | Status: DC

## 2020-12-31 NOTE — Telephone Encounter (Signed)
I refilled the Adderall. Tell her that I will contact the therapist and ask him to call her asap

## 2021-01-06 ENCOUNTER — Other Ambulatory Visit: Payer: Self-pay | Admitting: Family Medicine

## 2021-02-13 ENCOUNTER — Encounter: Payer: Self-pay | Admitting: Family Medicine

## 2021-02-14 MED ORDER — LISDEXAMFETAMINE DIMESYLATE 70 MG PO CAPS: 70.0000 mg | ORAL_CAPSULE | Freq: Every day | ORAL | 0 refills | Status: DC

## 2021-02-14 NOTE — Telephone Encounter (Signed)
Done

## 2021-02-15 ENCOUNTER — Encounter: Payer: Self-pay | Admitting: Family Medicine

## 2021-02-15 MED ORDER — TRETINOIN 0.1 % EX CREA: TOPICAL_CREAM | Freq: Every day | CUTANEOUS | 5 refills | Status: AC

## 2021-02-15 NOTE — Telephone Encounter (Signed)
I sent in the Tretinoin cream. Yes it should help with the scarring

## 2021-03-07 ENCOUNTER — Encounter: Payer: Self-pay | Admitting: Family Medicine

## 2021-03-08 MED ORDER — ATOMOXETINE HCL 80 MG PO CAPS: 80.0000 mg | ORAL_CAPSULE | Freq: Every day | ORAL | 5 refills | Status: DC

## 2021-03-08 NOTE — Telephone Encounter (Signed)
I understand. We will stop the Adderall and try to stay off all stimulant medications. I sent in a new med for her to try Jessica Lucero) that is for ADHD but is NOT a stimulant. We will start at 80 mg daily, but we can increase the dose if needed. Tell her that this will not work immediately but should kick in after taking it a week or two

## 2021-03-18 MED ORDER — LISDEXAMFETAMINE DIMESYLATE 70 MG PO CAPS: 70.0000 mg | ORAL_CAPSULE | Freq: Every day | ORAL | 0 refills | Status: DC

## 2021-03-18 NOTE — Telephone Encounter (Signed)
I refilled the Vyvanse

## 2021-03-23 ENCOUNTER — Other Ambulatory Visit: Payer: Self-pay | Admitting: Family Medicine

## 2021-03-23 MED ORDER — SUMATRIPTAN SUCCINATE 100 MG PO TABS: ORAL_TABLET | ORAL | 0 refills | Status: AC

## 2021-04-05 ENCOUNTER — Encounter: Payer: Self-pay | Admitting: Family Medicine

## 2021-04-05 ENCOUNTER — Other Ambulatory Visit: Payer: Self-pay | Admitting: Family Medicine

## 2021-04-06 NOTE — Telephone Encounter (Signed)
Her pharmacy already has refills available until May 17  ?

## 2021-04-08 MED ORDER — LISDEXAMFETAMINE DIMESYLATE 70 MG PO CAPS: 70.0000 mg | ORAL_CAPSULE | Freq: Every day | ORAL | 0 refills | Status: DC

## 2021-04-08 MED ORDER — AMPHETAMINE-DEXTROAMPHETAMINE 20 MG PO TABS: 20.0000 mg | ORAL_TABLET | Freq: Every day | ORAL | 0 refills | Status: DC

## 2021-04-08 NOTE — Telephone Encounter (Signed)
I sent in the Adderall. The computer took Adderall off her list so I forgot she takes both of them  ?

## 2021-04-08 NOTE — Telephone Encounter (Signed)
I went ahead and sent in all new refills  ?

## 2021-06-18 ENCOUNTER — Other Ambulatory Visit: Payer: Self-pay | Admitting: Family Medicine

## 2021-07-16 IMAGING — DX DG THORACIC SPINE 2V
2 series · 2 of 2 positions shown · non-contrast
Comparison: None.

CLINICAL DATA: Upper back pain

EXAM:
THORACIC SPINE 2 VIEWS

[thoracic spine ap]
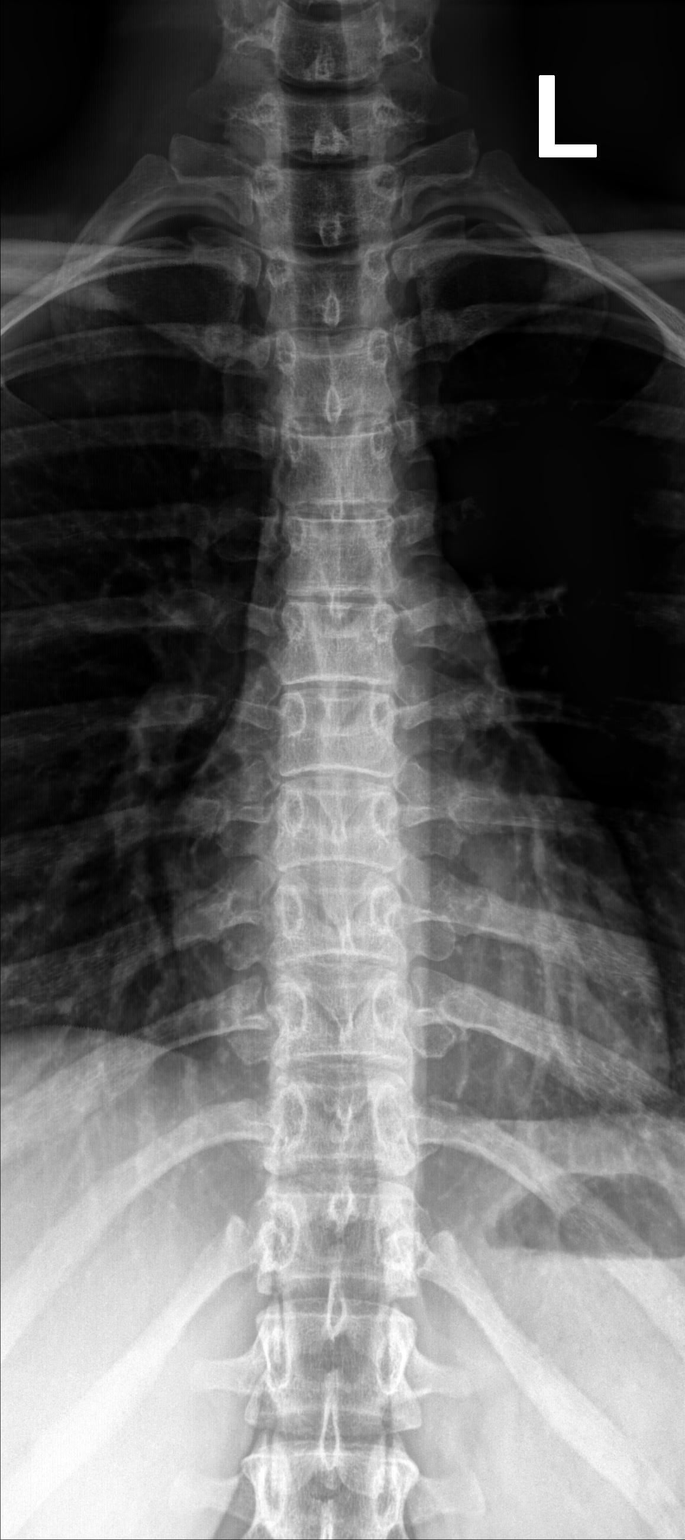

[thoracic spine lat]
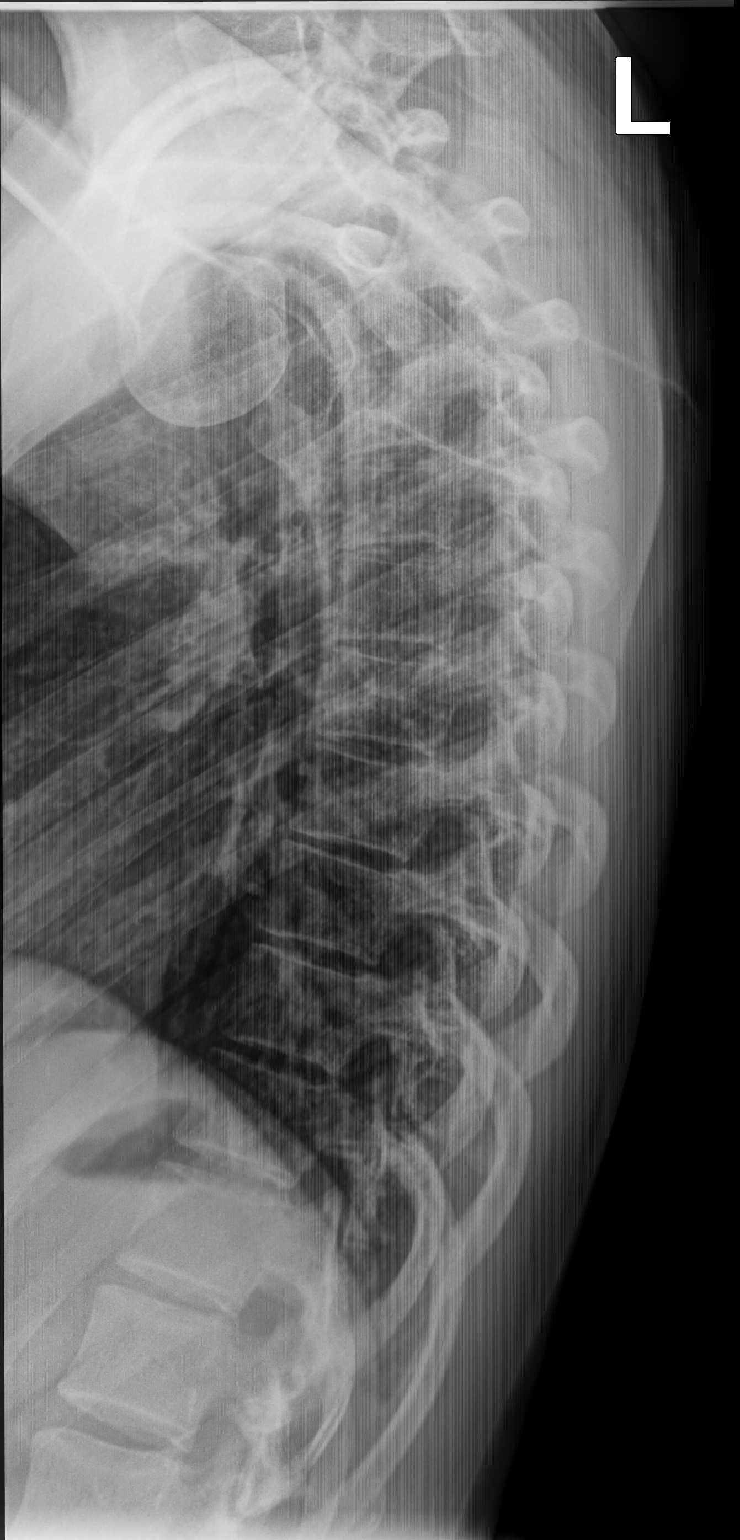

[2 of 2 positions shown; findings below may reference images not displayed]

FINDINGS: There is no evidence of thoracic spine fracture. Alignment is
normal. No other significant bone abnormalities are identified.
IMPRESSION: Negative.

## 2021-07-17 ENCOUNTER — Other Ambulatory Visit: Payer: Self-pay | Admitting: Family Medicine

## 2021-07-18 ENCOUNTER — Other Ambulatory Visit: Payer: Self-pay | Admitting: Family Medicine

## 2021-07-18 NOTE — Telephone Encounter (Signed)
Last OV- 12/14/20 Last refill- 06/08/21--30 tabs, 0 refills  No future OV scheduled.

## 2021-07-18 NOTE — Telephone Encounter (Signed)
Last OV- 12/14/20 Last refill-06/08/21--30 tabs, 0 refill  No future OV scheduled

## 2021-07-19 MED ORDER — AMPHETAMINE-DEXTROAMPHETAMINE 20 MG PO TABS: 20.0000 mg | ORAL_TABLET | Freq: Every day | ORAL | 0 refills | Status: DC

## 2021-07-19 MED ORDER — LISDEXAMFETAMINE DIMESYLATE 70 MG PO CAPS
70.0000 mg | ORAL_CAPSULE | Freq: Every day | ORAL | 0 refills | Status: DC
Start: 2021-09-18 — End: 2021-10-19

## 2021-07-19 MED ORDER — LISDEXAMFETAMINE DIMESYLATE 70 MG PO CAPS
70.0000 mg | ORAL_CAPSULE | Freq: Every day | ORAL | 0 refills | Status: DC
Start: 2021-08-18 — End: 2021-07-19

## 2021-07-19 MED ORDER — LISDEXAMFETAMINE DIMESYLATE 70 MG PO CAPS: 70.0000 mg | ORAL_CAPSULE | Freq: Every day | ORAL | 0 refills | Status: DC

## 2021-07-19 NOTE — Telephone Encounter (Signed)
Done

## 2021-09-20 ENCOUNTER — Telehealth: Payer: Self-pay | Admitting: Family Medicine

## 2021-09-20 MED ORDER — LISDEXAMFETAMINE DIMESYLATE 50 MG PO CAPS: 50.0000 mg | ORAL_CAPSULE | Freq: Every day | ORAL | 0 refills | Status: DC

## 2021-09-20 NOTE — Telephone Encounter (Signed)
Pt called to request a refill of the  lisdexamfetamine (VYVANSE) 70 MG capsule  BUT...  She would like dosage lowered to 50 mg temporarily, due to Rx not being available at her usual pharmacy. Pt found another pharmacy that has it,  but only 50 mg.  The pharmacy listed below currently has it in stock (only 50 mg) but only for a short time.  *Please send to  [facility], [address] Phone:  [phone]  Fax:  [phone]     Pt's LOV:  12/14/20  Pt scheduled to come in on 10/04/21.  Please advise.

## 2021-09-20 NOTE — Telephone Encounter (Signed)
Pharmacy updated.

## 2021-09-20 NOTE — Telephone Encounter (Signed)
I sent in a 30 day supply

## 2021-10-04 ENCOUNTER — Other Ambulatory Visit: Payer: Self-pay | Admitting: Family Medicine

## 2021-10-04 ENCOUNTER — Ambulatory Visit: Payer: No Typology Code available for payment source | Admitting: Family Medicine

## 2021-10-13 ENCOUNTER — Other Ambulatory Visit: Payer: Self-pay | Admitting: Family Medicine

## 2021-10-14 ENCOUNTER — Other Ambulatory Visit: Payer: Self-pay | Admitting: Family Medicine

## 2021-10-14 MED ORDER — AMPHETAMINE-DEXTROAMPHETAMINE 20 MG PO TABS: 20.0000 mg | ORAL_TABLET | Freq: Every day | ORAL | 0 refills | Status: DC

## 2021-10-14 MED ORDER — AMPHETAMINE-DEXTROAMPHETAMINE 20 MG PO TABS
20.0000 mg | ORAL_TABLET | Freq: Every day | ORAL | 0 refills | Status: DC
Start: 2021-10-14 — End: 2021-10-14

## 2021-10-14 NOTE — Telephone Encounter (Signed)
Done

## 2021-10-14 NOTE — Telephone Encounter (Signed)
Last refill-09-18-21--30 tabs, 0 refill Last OV-12/14/20  No future OV scheduled

## 2021-10-18 ENCOUNTER — Telehealth: Payer: Self-pay | Admitting: Family Medicine

## 2021-10-18 NOTE — Telephone Encounter (Signed)
Pt need generic

## 2021-10-18 NOTE — Telephone Encounter (Signed)
Request refills for lisdexamfetamine (VYVANSE) 70 MG capsule to go to  [facility], [place] [address] Phone:  [phone]  Fax:  [phone]    Patients asks that this is filled asap before the pharmacy runs out

## 2021-10-18 NOTE — Telephone Encounter (Signed)
Last refill-09/20/21-30 tabs 0 refills Last OV-12/14/2020  No future OV scheduled.

## 2021-10-19 MED ORDER — LISDEXAMFETAMINE DIMESYLATE 70 MG PO CAPS: 70.0000 mg | ORAL_CAPSULE | Freq: Every day | ORAL | 0 refills | Status: DC

## 2021-10-19 NOTE — Telephone Encounter (Signed)
Done

## 2021-12-09 ENCOUNTER — Telehealth: Payer: No Typology Code available for payment source | Admitting: Emergency Medicine

## 2021-12-09 DIAGNOSIS — J069 Acute upper respiratory infection, unspecified: Secondary | ICD-10-CM | POA: Diagnosis not present

## 2021-12-09 NOTE — Progress Notes (Signed)
Virtual Visit Consent   Jessica Lucero, you are scheduled for a virtual visit with a [facility] provider today. Just as with appointments in the office, your consent must be obtained to participate. Your consent will be active for this visit and any virtual visit you may have with one of our providers in the next 365 days. If you have a MyChart account, a copy of this consent can be sent to you electronically.  As this is a virtual visit, video technology does not allow for your provider to perform a traditional examination. This may limit your provider's ability to fully assess your condition. If your provider identifies any concerns that need to be evaluated in person or the need to arrange testing (such as labs, EKG, etc.), we will make arrangements to do so. Although advances in technology are sophisticated, we cannot ensure that it will always work on either your end or our end. If the connection with a video visit is poor, the visit may have to be switched to a telephone visit. With either a video or telephone visit, we are not always able to ensure that we have a secure connection.  By engaging in this virtual visit, you consent to the provision of healthcare and authorize for your insurance to be billed (if applicable) for the services provided during this visit. Depending on your insurance coverage, you may receive a charge related to this service.  I need to obtain your verbal consent now. Are you willing to proceed with your visit today? Jessica Lucero has provided verbal consent on 12/09/2021 for a virtual visit (video or telephone). Carvel Getting, NP  Date: 12/09/2021 11:22 AM  Virtual Visit via Video Note   I, Carvel Getting, connected with  Jessica Lucero  ([id number], Jun 10, 1995) on 12/09/21 at 11:00 AM EST by a video-enabled telemedicine application and verified that I am speaking with the correct person using two identifiers.  Location: Patient: Virtual Visit  Location Patient: Home Provider: Virtual Visit Location Provider: Home Office   I discussed the limitations of evaluation and management by telemedicine and the availability of in person appointments. The patient expressed understanding and agreed to proceed.    History of Present Illness: Jessica Lucero is a 26 y.o. who identifies as a female who was assigned female at birth, and is being seen today for congestion.  Patient has been ill since 12/05/2021.  Felt a little better on the seventh and went out for her birthday.  Then felt worse again 12/07/2021 through today.  Is very congested.  Has tried Mucinex, Robitussin, Benadryl PE, and DayQuil for little relief of her symptoms.  She does feel little achy.  She denies fever or chills.  She has no shortness of breath or wheezing.  Does have postnasal drainage.  Her congestion is clear.  HPI: HPI  Problems:  Patient Active Problem List   Diagnosis Date Noted   Depression with anxiety 10/13/2019   Chronic neck pain 02/04/2019   Large breasts 10/23/2016   Acne vulgaris 01/20/2013   Migraines 01/20/2013   CERVICAL LYMPHADENOPATHY 05/17/2007   Attention deficit hyperactivity disorder (ADHD) 09/07/2006   ECZEMA 09/07/2006    Allergies: No Known Allergies Medications:  Current Outpatient Medications:    [START ON 12/14/2021] amphetamine-dextroamphetamine (ADDERALL) 20 MG tablet, Take 1 tablet (20 mg total) by mouth daily in the afternoon., Disp: 30 tablet, Rfl: 0   busPIRone (BUSPAR) 10 MG tablet, Take 2 tablets (20 mg total) by mouth 2 (two) times daily.,  Disp: 120 tablet, Rfl: 5   JUNEL FE 1/20 1-20 MG-MCG tablet, TAKE 1 TABLET BY MOUTH EVERY DAY, Disp: 84 tablet, Rfl: 0   [START ON 12/19/2021] lisdexamfetamine (VYVANSE) 70 MG capsule, Take 1 capsule (70 mg total) by mouth daily., Disp: 30 capsule, Rfl: 0   SUMAtriptan (IMITREX) 100 MG tablet, TAKE 1 TABLET IF NEEDED FOR HEADACHE- MAY REPEAT IN 2 HOURS IF NEEDED, Disp: 9 tablet, Rfl: 0    tretinoin (RETIN-A) 0.1 % cream, Apply topically at bedtime., Disp: 45 g, Rfl: 5  Observations/Objective: Patient is well-developed, well-nourished in no acute distress.  Resting comfortably  at home.  Head is normocephalic, atraumatic.  No labored breathing.  Speech is clear and coherent with logical content.  Patient is alert and oriented at baseline.    Assessment and Plan: 1. Upper respiratory tract infection, unspecified type  Reviewed supportive care measures.   Follow Up Instructions: I discussed the assessment and treatment plan with the patient. The patient was provided an opportunity to ask questions and all were answered. The patient agreed with the plan and demonstrated an understanding of the instructions.  A copy of instructions were sent to the patient via MyChart unless otherwise noted below.    The patient was advised to call back or seek an in-person evaluation if the symptoms worsen or if the condition fails to improve as anticipated.  Time:  I spent 12 minutes with the patient via telehealth technology discussing the above problems/concerns.    Carvel Getting, NP

## 2021-12-09 NOTE — Patient Instructions (Signed)
Veneda Melter, thank you for joining Carvel Getting, NP for today's virtual visit.  While this provider is not your primary care provider (PCP), if your PCP is located in our provider database this encounter information will be shared with them immediately following your visit.   [facility] account gives you access to today's visit and all your visits, tests, and labs performed at [facility] " click here if you don't have a [facility] account or go to [email or web address]  Consent: (Patient) Jessica Lucero provided verbal consent for this virtual visit at the beginning of the encounter.  Current Medications:  Current Outpatient Medications:    [START ON 12/14/2021] amphetamine-dextroamphetamine (ADDERALL) 20 MG tablet, Take 1 tablet (20 mg total) by mouth daily in the afternoon., Disp: 30 tablet, Rfl: 0   busPIRone (BUSPAR) 10 MG tablet, Take 2 tablets (20 mg total) by mouth 2 (two) times daily., Disp: 120 tablet, Rfl: 5   JUNEL FE 1/20 1-20 MG-MCG tablet, TAKE 1 TABLET BY MOUTH EVERY DAY, Disp: 84 tablet, Rfl: 0   [START ON 12/19/2021] lisdexamfetamine (VYVANSE) 70 MG capsule, Take 1 capsule (70 mg total) by mouth daily., Disp: 30 capsule, Rfl: 0   SUMAtriptan (IMITREX) 100 MG tablet, TAKE 1 TABLET IF NEEDED FOR HEADACHE- MAY REPEAT IN 2 HOURS IF NEEDED, Disp: 9 tablet, Rfl: 0   tretinoin (RETIN-A) 0.1 % cream, Apply topically at bedtime., Disp: 45 g, Rfl: 5   Medications ordered in this encounter:  No orders of the defined types were placed in this encounter.    *If you need refills on other medications prior to your next appointment, please contact your pharmacy*  Follow-Up: Call back or seek an in-person evaluation if the symptoms worsen or if the condition fails to improve as anticipated.  [facility] [phone]  Other Instructions Use either Mucinex or Robitussin but not both as they are the same medication.  I  prefer Mucinex as it is an extended release medicine that you only have to take twice a day.  Drink lots of liquids with this and it will help thin out your congestion and help it drain easier.  An effective nasal decongestant is Afrin nasal spray (okay to use generic brands).  Please follow the directions of this medicine carefully and note you are only to use it for 3 days each illness.  Try using saline irrigation, such as with a neti pot, several times a day while you are sick. Many neti pots come with salt packets premeasured to use to make saline. If you use your own salt, make sure it is kosher salt or sea salt (don't use table salt as it has iodine in it and you don't need that in your nose). Use distilled water to make saline. If you mix your own saline using your own salt, the recipe is 1/4 teaspoon salt in 1 cup warm water. Using saline irrigation can help prevent and treat sinus infections.  I recommend you test yourself for COVID at home if you have not done so already.   If you have been instructed to have an in-person evaluation today at a local Urgent Care facility, please use the link below. It will take you to a list of all of our available [facility], including address, phone number and hours of operation. Please do not delay care.  [facility]  If you or a family member do not have a primary care  provider, use the link below to schedule a visit and establish care. When you choose a [facility] primary care physician or advanced practice provider, you gain a long-term partner in health. Find a Primary Care Provider  Learn more about [facility]'s in-office and virtual care options: [facility]

## 2021-12-13 ENCOUNTER — Other Ambulatory Visit: Payer: Self-pay | Admitting: Family Medicine

## 2022-01-03 ENCOUNTER — Other Ambulatory Visit: Payer: Self-pay | Admitting: Family Medicine

## 2022-01-14 ENCOUNTER — Other Ambulatory Visit: Payer: No Typology Code available for payment source | Admitting: Family Medicine

## 2022-01-16 NOTE — Telephone Encounter (Signed)
Pt LOV was on 12/09/21 Last refill was done on 12/19/21 Please  advise

## 2022-01-16 NOTE — Telephone Encounter (Signed)
Spoke with patient, she is requesting 20 tabs of generic or brand name for Vyvanse to be sent to [facility] On Jan 1st 2024, patient will have new medical insurance and at that time she will contact the new insurance company to discuss which ADHD medication is covered, and then will schedule office visit appointment to discuss in person medication coverages.

## 2022-01-16 NOTE — Telephone Encounter (Signed)
Pt called and stated if Dr can prescribe 20 pills for this medication so she can use her coupon. It can be the generic or the regular brand. Also pt would like a call back from nurse so she can know what she needs to do moving forward as far as her INS.   Please advise.

## 2022-01-16 NOTE — Telephone Encounter (Signed)
Tell her to talk to her insurance to find out what ADHD meds they do cover

## 2022-01-17 MED ORDER — LISDEXAMFETAMINE DIMESYLATE 70 MG PO CAPS: 70.0000 mg | ORAL_CAPSULE | Freq: Every day | ORAL | 0 refills | Status: DC

## 2022-02-06 ENCOUNTER — Telehealth: Payer: Self-pay | Admitting: Family Medicine

## 2022-02-06 ENCOUNTER — Encounter: Payer: No Typology Code available for payment source | Admitting: Family Medicine

## 2022-02-06 MED ORDER — LISDEXAMFETAMINE DIMESYLATE 70 MG PO CAPS: 70.0000 mg | ORAL_CAPSULE | Freq: Every day | ORAL | 0 refills | Status: DC

## 2022-02-06 NOTE — Addendum Note (Signed)
Addended by: Alysia Penna A on: 02/06/2022 12:56 PM   Modules accepted: Orders

## 2022-02-06 NOTE — Telephone Encounter (Signed)
I refilled ONE time only. She will need an in person OV for any more

## 2022-02-06 NOTE — Telephone Encounter (Signed)
Requestng refill of 10 pills lisdexamfetamine (VYVANSE) 70 MG capsule  [facility] - [place], [place] - [address] Phone: [phone]  Fax: [phone]

## 2022-02-06 NOTE — Telephone Encounter (Signed)
Last refill-01/17/22--20 capsules, 0 refills Last OV-12/14/2020  Next OV-02/27/2022

## 2022-02-10 ENCOUNTER — Other Ambulatory Visit: Payer: Self-pay | Admitting: Family Medicine

## 2022-02-10 NOTE — Telephone Encounter (Signed)
No further refills without an in person OV

## 2022-02-10 NOTE — Telephone Encounter (Signed)
Last OV-12/14/20 Last refill-12/14/21--30 tabs, 0 refills  Next VV-02/14/22 Next CPE-02/27/22

## 2022-02-14 ENCOUNTER — Encounter: Payer: Self-pay | Admitting: Family Medicine

## 2022-02-14 ENCOUNTER — Telehealth (INDEPENDENT_AMBULATORY_CARE_PROVIDER_SITE_OTHER): Payer: 59 | Admitting: Family Medicine

## 2022-02-14 DIAGNOSIS — B070 Plantar wart: Secondary | ICD-10-CM | POA: Insufficient documentation

## 2022-02-14 DIAGNOSIS — R69 Illness, unspecified: Secondary | ICD-10-CM | POA: Diagnosis not present

## 2022-02-14 DIAGNOSIS — F900 Attention-deficit hyperactivity disorder, predominantly inattentive type: Secondary | ICD-10-CM

## 2022-02-14 MED ORDER — AMPHETAMINE-DEXTROAMPHET ER 20 MG PO CP24: 20.0000 mg | ORAL_CAPSULE | ORAL | 0 refills | Status: DC

## 2022-02-14 MED ORDER — AMPHETAMINE-DEXTROAMPHETAMINE 20 MG PO TABS: 20.0000 mg | ORAL_TABLET | Freq: Every day | ORAL | 0 refills | Status: DC

## 2022-02-14 NOTE — Progress Notes (Signed)
Subjective:    Patient ID: Jessica Lucero, female    DOB: [birth year], 27 y.o.   MRN: [id number]  HPI Virtual Visit via Video Note  I connected with the patient on 02/14/22 at  1:30 PM EST by a video enabled telemedicine application and verified that I am speaking with the correct person using two identifiers.  Location patient: home Location provider:work or home office Persons participating in the virtual visit: patient, provider  I discussed the limitations of evaluation and management by telemedicine and the availability of in person appointments. The patient expressed understanding and agreed to proceed.   HPI: Here for 2 issues. First she needs refills on ADHD medication, but her current insurance will not cover Vyvanse. They will cover Adderall however. Her ADHD has been doing well. The other issue is painful lesions all over the bottom of her right foot. These first appeared about 3 months ago, and they have been spreading. They are very painful to walk on.    ROS: See pertinent positives and negatives per HPI.  Past Medical History:  Diagnosis Date   ADHD (attention deficit hyperactivity disorder)    Frequent UTI     Past Surgical History:  Procedure Laterality Date   CYSTOSCOPY      Family History  Problem Relation Age of Onset   Depression Other    Anxiety disorder Other      Current Outpatient Medications:    JUNEL FE 1/20 1-20 MG-MCG tablet, TAKE 1 TABLET BY MOUTH EVERY DAY, Disp: 84 tablet, Rfl: 1   SUMAtriptan (IMITREX) 100 MG tablet, TAKE 1 TABLET IF NEEDED FOR HEADACHE- MAY REPEAT IN 2 HOURS IF NEEDED, Disp: 9 tablet, Rfl: 0   tretinoin (RETIN-A) 0.1 % cream, Apply topically at bedtime., Disp: 45 g, Rfl: 5   amphetamine-dextroamphetamine (ADDERALL XR) 20 MG 24 hr capsule, Take 1 capsule (20 mg total) by mouth every morning., Disp: 30 capsule, Rfl: 0   amphetamine-dextroamphetamine (ADDERALL) 20 MG tablet, Take 1 tablet (20 mg total) by mouth daily in  the afternoon., Disp: 30 tablet, Rfl: 0  EXAM:  VITALS per patient if applicable:  GENERAL: alert, oriented, appears well and in no acute distress  HEENT: atraumatic, conjunttiva clear, no obvious abnormalities on inspection of external nose and ears  NECK: normal movements of the head and neck  LUNGS: on inspection no signs of respiratory distress, breathing rate appears normal, no obvious gross SOB, gasping or wheezing  CV: no obvious cyanosis  MS: moves all visible extremities without noticeable abnormality  PSYCH/NEURO: pleasant and cooperative, no obvious depression or anxiety, speech and thought processing grossly intact  SKIN: the sole of the right foot has multiple slightly raised lesions that have a grayish color  ASSESSMENT AND PLAN: For her ADHD, we will stop Vyvanse and take Adderall XR 20 mg every morning along with the immediate release Adderall in the afternoon. 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 We will refer her to Podiatry to treat these.  Alysia Penna, MD  Discussed the following assessment and plan:  No diagnosis found.     I discussed the assessment and treatment plan with the patient. The patient was provided an opportunity to ask questions and all were answered. The patient agreed with the plan and demonstrated an understanding of the instructions.   The patient was advised to call back or seek an in-person evaluation if the symptoms worsen or if the condition fails to improve as anticipated.  Review of Systems     Objective:   Physical Exam        Assessment & Plan:

## 2022-02-14 NOTE — Telephone Encounter (Signed)
We discussed this during the OV

## 2022-02-21 ENCOUNTER — Ambulatory Visit: Payer: 59 | Admitting: Podiatry

## 2022-02-21 DIAGNOSIS — B070 Plantar wart: Secondary | ICD-10-CM | POA: Diagnosis not present

## 2022-02-21 DIAGNOSIS — M79671 Pain in right foot: Secondary | ICD-10-CM

## 2022-02-21 NOTE — Patient Instructions (Signed)
Take dressing off in 8 hours and wash the foot with soap and water. If it is hurting or becomes uncomfortable before the 8 hours, go ahead and remove the bandage and wash the area.  If it blisters, apply antibiotic ointment and a band-aid.  Monitor for any signs/symptoms of infection. Call the office immediately if any occur or go directly to the emergency room. Call with any questions/concerns.   

## 2022-02-21 NOTE — Progress Notes (Unsigned)
Subjective:   Patient ID: Jessica Lucero, female   DOB: 27 y.o.   MRN: [id number]   HPI Chief Complaint  Patient presents with   Plantar Warts    Right foot, ball of foot    The symptoms started about 6 months ago. At first she thought it was a splinter and did not see anything. She then tired freeze off and it did not help. She thought it would go away on its own but it has not.    Review of Systems  All other systems reviewed and are negative.       Objective:  Physical Exam  ***     Assessment:  ***     Plan:  ***    Righ tmutiple Cantrhole [facility]

## 2022-02-22 ENCOUNTER — Telehealth: Payer: Self-pay | Admitting: *Deleted

## 2022-02-22 NOTE — Telephone Encounter (Addendum)
Patient is wanting the compound medication changed from [facility] to another pharmacy in [place], closer to her house, did not speak  the name clearly,left message to call back with that information. I did receive the pharmacy ,entered in chart. Called them to make sure that they do compound orders and they do if it is not a sterile medication.

## 2022-02-22 NOTE — Telephone Encounter (Signed)
Pt called back and the pharmacy  she would like it sent to in [place] is [facility] at [address]. The phone number is [phone]

## 2022-02-23 NOTE — Telephone Encounter (Signed)
Jessica Lucero is calling for the status of a compound medication that was supposed to be sent to them,please advise.

## 2022-02-27 ENCOUNTER — Ambulatory Visit (INDEPENDENT_AMBULATORY_CARE_PROVIDER_SITE_OTHER): Payer: 59 | Admitting: Family Medicine

## 2022-02-27 ENCOUNTER — Encounter: Payer: Self-pay | Admitting: Family Medicine

## 2022-02-27 VITALS — BP 130/80 | HR 103 | Temp 98.3°F | Ht 62.5 in | Wt 183.2 lb

## 2022-02-27 DIAGNOSIS — N912 Amenorrhea, unspecified: Secondary | ICD-10-CM | POA: Diagnosis not present

## 2022-02-27 DIAGNOSIS — Z209 Contact with and (suspected) exposure to unspecified communicable disease: Secondary | ICD-10-CM | POA: Diagnosis not present

## 2022-02-27 DIAGNOSIS — Z0000 Encounter for general adult medical examination without abnormal findings: Secondary | ICD-10-CM

## 2022-02-27 DIAGNOSIS — E663 Overweight: Secondary | ICD-10-CM

## 2022-02-27 LAB — BASIC METABOLIC PANEL
BUN: 11 mg/dL (ref 6–23)
CO2: 22 mEq/L (ref 19–32)
Calcium: 9 mg/dL (ref 8.4–10.5)
Chloride: 104 mEq/L (ref 96–112)
Creatinine, Ser: 0.65 mg/dL (ref 0.40–1.20)
GFR: 121.68 mL/min (ref >60.00)
Glucose, Bld: 94 mg/dL (ref 70–99)
Potassium: 3.7 mEq/L (ref 3.5–5.1)
Sodium: 135 mEq/L (ref 135–145)

## 2022-02-27 LAB — CBC WITH DIFFERENTIAL/PLATELET
Basophils Absolute: 0 10*3/uL (ref 0.0–0.1)
Basophils Relative: 0.5 % (ref 0.0–3.0)
Eosinophils Absolute: 0.2 10*3/uL (ref 0.0–0.7)
Eosinophils Relative: 2.1 % (ref 0.0–5.0)
HCT: 39.1 % (ref 36.0–46.0)
Hemoglobin: 13.4 g/dL (ref 12.0–15.0)
Lymphocytes Relative: 27.5 % (ref 12.0–46.0)
Lymphs Abs: 2.3 10*3/uL (ref 0.7–4.0)
MCHC: 34.3 g/dL (ref 30.0–36.0)
MCV: 87.1 fl (ref 78.0–100.0)
Monocytes Absolute: 0.5 10*3/uL (ref 0.1–1.0)
Monocytes Relative: 6.3 % (ref 3.0–12.0)
Neutro Abs: 5.4 10*3/uL (ref 1.4–7.7)
Neutrophils Relative %: 63.6 % (ref 43.0–77.0)
Platelets: 346 10*3/uL (ref 150.0–400.0)
RBC: 4.49 Mil/uL (ref 3.87–5.11)
RDW: 12.5 % (ref 11.5–15.5)
WBC: 8.4 10*3/uL (ref 4.0–10.5)

## 2022-02-27 LAB — HEPATIC FUNCTION PANEL
ALT: 13 U/L (ref 0–35)
AST: 16 U/L (ref 0–37)
Albumin: 4.4 g/dL (ref 3.5–5.2)
Alkaline Phosphatase: 34 U/L — ABNORMAL LOW (ref 39–117)
Bilirubin, Direct: 0.1 mg/dL (ref 0.0–0.3)
Total Bilirubin: 0.4 mg/dL (ref 0.2–1.2)
Total Protein: 7.3 g/dL (ref 6.0–8.3)

## 2022-02-27 LAB — VITAMIN B12: Vitamin B-12: 273 pg/mL (ref 211–911)

## 2022-02-27 LAB — LDL CHOLESTEROL, DIRECT: Direct LDL: 110 mg/dL

## 2022-02-27 LAB — LIPID PANEL
Cholesterol: 191 mg/dL (ref 0–200)
HDL: 48.8 mg/dL (ref >39.00)
NonHDL: 142.6
Total CHOL/HDL Ratio: 4
Triglycerides: 209 mg/dL — ABNORMAL HIGH (ref 0.0–149.0)
VLDL: 41.8 mg/dL — ABNORMAL HIGH (ref 0.0–40.0)

## 2022-02-27 LAB — TESTOSTERONE: Testosterone: 35.06 ng/dL (ref 15.00–40.00)

## 2022-02-27 LAB — VITAMIN D 25 HYDROXY (VIT D DEFICIENCY, FRACTURES): VITD: 18.3 ng/mL — ABNORMAL LOW (ref 30.00–100.00)

## 2022-02-27 LAB — T4, FREE: Free T4: 0.67 ng/dL (ref 0.60–1.60)

## 2022-02-27 LAB — T3, FREE: T3, Free: 3.1 pg/mL (ref 2.3–4.2)

## 2022-02-27 LAB — TSH: TSH: 4.28 u[IU]/mL (ref 0.35–5.50)

## 2022-02-27 LAB — HEMOGLOBIN A1C: Hgb A1c MFr Bld: 5.5 % (ref 4.6–6.5)

## 2022-02-27 NOTE — Progress Notes (Signed)
Subjective:    Patient ID: Jessica Lucero, female    DOB: [birth year], 27 y.o.   MRN: [id number]  HPI Here for a well exam. She has a few issues to discuss. First she is concerned about her weight. She has gained about 80 lbs in the past year, and she does not know why. She tries to eat a healthy diet, she drinks a lot of water, and she exercises like she has for the past few years, so this has been a surprise. She also asks to her her hormone levels checked because she has not had a menstrual cycle for 4 months. She takes her BCP continuously for 3 months and then she stop for a week. She normally has a period during that week but she has not had one yet. No abdominal symptoms or nausea or breast tenderness. She also asks to have some STD checks for her piece of mind. She has a steady boyfriend. She recently saw Podiatry for warts on her right foot, and he is treating this with Cantherone. She plans to see her GYN for a Pap smear in April.    Review of Systems  Constitutional:  Positive for unexpected weight change. Negative for activity change and appetite change.  HENT: Negative.    Eyes: Negative.   Respiratory: Negative.    Cardiovascular: Negative.   Gastrointestinal: Negative.   Genitourinary:  Negative for decreased urine volume, difficulty urinating, dyspareunia, dysuria, enuresis, flank pain, frequency, hematuria, pelvic pain and urgency.  Musculoskeletal: Negative.   Skin: Negative.   Neurological: Negative.  Negative for headaches.  Psychiatric/Behavioral: Negative.         Objective:   Physical Exam Constitutional:      General: She is not in acute distress.    Appearance: She is well-developed. She is obese.  HENT:     Head: Normocephalic and atraumatic.     Right Ear: External ear normal.     Left Ear: External ear normal.     Nose: Nose normal.     Mouth/Throat:     Pharynx: No oropharyngeal exudate.  Eyes:     General: No scleral icterus.     Conjunctiva/sclera: Conjunctivae normal.     Pupils: Pupils are equal, round, and reactive to light.  Neck:     Thyroid: No thyromegaly.     Vascular: No JVD.  Cardiovascular:     Rate and Rhythm: Normal rate and regular rhythm.     Pulses: Normal pulses.     Heart sounds: Normal heart sounds. No murmur heard.    No friction rub. No gallop.  Pulmonary:     Effort: Pulmonary effort is normal. No respiratory distress.     Breath sounds: Normal breath sounds. No wheezing or rales.  Chest:     Chest wall: No tenderness.  Abdominal:     General: Bowel sounds are normal. There is no distension.     Palpations: Abdomen is soft. There is no mass.     Tenderness: There is no abdominal tenderness. There is no guarding or rebound.  Musculoskeletal:        General: No tenderness. Normal range of motion.     Cervical back: Normal range of motion and neck supple.  Lymphadenopathy:     Cervical: No cervical adenopathy.  Skin:    General: Skin is warm and dry.     Findings: No erythema or rash.  Neurological:     Mental Status: She is alert and oriented to person,  place, and time.     Cranial Nerves: No cranial nerve deficit.     Motor: No abnormal muscle tone.     Coordination: Coordination normal.     Deep Tendon Reflexes: Reflexes are normal and symmetric. Reflexes normal.  Psychiatric:        Mood and Affect: Mood normal.        Behavior: Behavior normal.        Thought Content: Thought content normal.        Judgment: Judgment normal.           Assessment & Plan:  Well exam. We discussed diet and exercise. We will refer her to Nutrition to help with the weight gain. Get fasting labs. We will do some STD checks and hormone levels. We will do a urine pregnancy test as well.  Alysia Penna, MD

## 2022-03-03 ENCOUNTER — Telehealth: Payer: Self-pay | Admitting: Family Medicine

## 2022-03-03 NOTE — Telephone Encounter (Signed)
Pt is calling and would like blood work result 

## 2022-03-04 LAB — ESTRADIOL: Estradiol: <15 pg/mL

## 2022-03-04 LAB — C. TRACHOMATIS/N. GONORRHOEAE RNA
C. trachomatis RNA, TMA: NOT DETECTED
N. gonorrhoeae RNA, TMA: NOT DETECTED

## 2022-03-04 LAB — PROGESTERONE: Progesterone: <0.5 ng/mL

## 2022-03-04 LAB — PREGNANCY, URINE: Preg Test, Ur: NEGATIVE

## 2022-03-04 LAB — RPR: RPR Ser Ql: NONREACTIVE

## 2022-03-04 LAB — HIV ANTIBODY (ROUTINE TESTING W REFLEX): HIV 1&2 Ab, 4th Generation: NONREACTIVE

## 2022-03-06 NOTE — Telephone Encounter (Signed)
Spoke with patient about lab results.

## 2022-03-06 NOTE — Telephone Encounter (Signed)
Lvm to call office back for results.

## 2022-03-20 ENCOUNTER — Other Ambulatory Visit: Payer: Self-pay | Admitting: Family Medicine

## 2022-03-20 MED ORDER — AMPHETAMINE-DEXTROAMPHET ER 20 MG PO CP24: 20.0000 mg | ORAL_CAPSULE | ORAL | 0 refills | Status: DC

## 2022-03-20 NOTE — Telephone Encounter (Signed)
Done

## 2022-03-20 NOTE — Telephone Encounter (Signed)
Medication request sent to PCP

## 2022-03-20 NOTE — Telephone Encounter (Signed)
Pt LOV was on 02/27/22 Last refill was done on 02/14/22 Please advise

## 2022-03-21 ENCOUNTER — Ambulatory Visit: Payer: 59 | Admitting: Podiatry

## 2022-04-04 ENCOUNTER — Ambulatory Visit: Payer: 59 | Admitting: Podiatry

## 2022-05-10 ENCOUNTER — Ambulatory Visit: Payer: 59 | Admitting: Skilled Nursing Facility1

## 2022-05-15 ENCOUNTER — Encounter: Payer: Self-pay | Admitting: Family Medicine

## 2022-05-16 MED ORDER — AMPHETAMINE-DEXTROAMPHETAMINE 20 MG PO TABS: 20.0000 mg | ORAL_TABLET | Freq: Every day | ORAL | 0 refills | Status: DC

## 2022-05-16 NOTE — Telephone Encounter (Signed)
Done

## 2022-07-04 ENCOUNTER — Other Ambulatory Visit: Payer: Self-pay | Admitting: Family Medicine

## 2022-07-04 ENCOUNTER — Encounter: Payer: Self-pay | Admitting: Family Medicine

## 2022-07-04 MED ORDER — AMPHETAMINE-DEXTROAMPHET ER 20 MG PO CP24: 20.0000 mg | ORAL_CAPSULE | ORAL | 0 refills | Status: DC

## 2022-07-04 NOTE — Telephone Encounter (Signed)
Done

## 2022-08-04 ENCOUNTER — Other Ambulatory Visit: Payer: Self-pay | Admitting: Family Medicine

## 2022-08-07 ENCOUNTER — Other Ambulatory Visit: Payer: Self-pay | Admitting: Family Medicine

## 2022-08-08 MED ORDER — AMPHETAMINE-DEXTROAMPHET ER 20 MG PO CP24: 20.0000 mg | ORAL_CAPSULE | ORAL | 0 refills | Status: DC

## 2022-08-08 NOTE — Telephone Encounter (Signed)
Done

## 2022-09-15 DIAGNOSIS — J019 Acute sinusitis, unspecified: Secondary | ICD-10-CM | POA: Diagnosis not present

## 2022-09-15 DIAGNOSIS — U071 COVID-19: Secondary | ICD-10-CM | POA: Diagnosis not present

## 2022-09-15 DIAGNOSIS — R051 Acute cough: Secondary | ICD-10-CM | POA: Diagnosis not present

## 2022-09-15 DIAGNOSIS — J029 Acute pharyngitis, unspecified: Secondary | ICD-10-CM | POA: Diagnosis not present

## 2022-09-22 DIAGNOSIS — S81811A Laceration without foreign body, right lower leg, initial encounter: Secondary | ICD-10-CM | POA: Diagnosis not present

## 2022-11-08 ENCOUNTER — Other Ambulatory Visit: Payer: Self-pay | Admitting: Family Medicine

## 2022-11-09 MED ORDER — AMPHETAMINE-DEXTROAMPHET ER 20 MG PO CP24: 20.0000 mg | ORAL_CAPSULE | ORAL | 0 refills | Status: DC

## 2022-11-09 NOTE — Telephone Encounter (Signed)
Done

## 2022-11-09 NOTE — Telephone Encounter (Signed)
Pt LOV was on 02/27/22 Last refill was done on 07/04/22 Please advise

## 2022-12-12 ENCOUNTER — Encounter: Payer: Self-pay | Admitting: Family Medicine

## 2022-12-15 MED ORDER — AMPHETAMINE-DEXTROAMPHETAMINE 20 MG PO TABS: ORAL_TABLET | ORAL | 0 refills | Status: DC

## 2022-12-15 NOTE — Telephone Encounter (Signed)
Done

## 2022-12-16 ENCOUNTER — Other Ambulatory Visit: Payer: Self-pay | Admitting: Family Medicine

## 2022-12-25 MED ORDER — AMPHETAMINE-DEXTROAMPHETAMINE 20 MG PO TABS: ORAL_TABLET | ORAL | 0 refills | Status: DC

## 2022-12-25 NOTE — Telephone Encounter (Signed)
Done

## 2023-02-06 ENCOUNTER — Other Ambulatory Visit: Payer: Self-pay | Admitting: Family Medicine

## 2023-02-07 NOTE — Telephone Encounter (Signed)
 Pt LOV was on 02/27/22 Please advise

## 2023-02-08 ENCOUNTER — Encounter: Payer: Self-pay | Admitting: Family Medicine

## 2023-02-08 ENCOUNTER — Other Ambulatory Visit: Payer: Self-pay | Admitting: Family Medicine

## 2023-02-08 MED ORDER — AMPHETAMINE-DEXTROAMPHET ER 20 MG PO CP24: 20.0000 mg | ORAL_CAPSULE | ORAL | 0 refills | Status: DC

## 2023-02-08 NOTE — Telephone Encounter (Signed)
 Done

## 2023-02-09 NOTE — Telephone Encounter (Signed)
 Already done

## 2023-02-09 NOTE — Telephone Encounter (Signed)
 This has already been sent, Please close pt chart

## 2023-02-13 NOTE — Telephone Encounter (Signed)
 Done

## 2023-04-23 ENCOUNTER — Other Ambulatory Visit: Payer: Self-pay | Admitting: Family Medicine

## 2023-04-26 MED ORDER — AMPHETAMINE-DEXTROAMPHET ER 20 MG PO CP24: 20.0000 mg | ORAL_CAPSULE | ORAL | 0 refills | Status: DC

## 2023-06-18 ENCOUNTER — Encounter: Payer: Self-pay | Admitting: Family Medicine

## 2023-06-19 MED ORDER — AMPHETAMINE-DEXTROAMPHET ER 20 MG PO CP24: 20.0000 mg | ORAL_CAPSULE | ORAL | 0 refills | Status: DC

## 2023-06-19 MED ORDER — AMPHETAMINE-DEXTROAMPHETAMINE 20 MG PO TABS: ORAL_TABLET | ORAL | 0 refills | Status: DC

## 2023-06-19 NOTE — Telephone Encounter (Signed)
 Done

## 2023-08-09 ENCOUNTER — Ambulatory Visit: Payer: Self-pay

## 2023-08-09 NOTE — Telephone Encounter (Signed)
 FYI Only or Action Required?: FYI only for provider.  Patient was last seen in primary care on 02/27/2022 by Johnny Garnette LABOR, MD.  Called Nurse Triage reporting Nausea and Neck Pain.  Symptoms began several days ago.  Interventions attempted: Rest, hydration, or home remedies.  Symptoms are: unchanged.  Triage Disposition: See HCP Within 4 Hours (Or PCP Triage)  Patient/caregiver understands and will follow disposition?:    Copied from CRM [id number]. Topic: Clinical - Red Word Triage >> Aug 09, 2023  1:33 PM [name] wrote: [facility] that prompted transfer to Nurse Triage: neck pain and nausea. Patient states she has eaten [place] bacon contaminated with listeria. Reason for Disposition  Neck pain or stiffness  Unexplained nausea  [1] Overly worried caller AND [2] can't be reassured by triager  Answer Assessment - Initial Assessment Questions 1. NAUSEA SEVERITY: How bad is the nausea? (e.g., mild, moderate, severe; dehydration, weight loss)     Mild 2. ONSET: When did the nausea begin?     Today 3. VOMITING: Any vomiting? If Yes, ask: How many times today?     No 4. RECURRENT SYMPTOM: Have you had nausea before? If Yes, ask: When was the last time? What happened that time?     No 5. CAUSE: What do you think is causing the nausea?     [place] Bacon    Additional info; Ate [place] bacon that tasted off, she ate this on Tuesday, woke up with neck pain and nausea.  Received call from [facility], her product was recalled for listeria, confirmed with lot number. She is concerned and requesting evaluation and testing for listeriosis  Answer Assessment - Initial Assessment Questions 1. ONSET: When did the pain begin?      Two days  2. LOCATION: Where does it hurt?      Back of neck near shoulders 3. PATTERN Does the pain come and go, or has it been constant since it started?      Constant  4. SEVERITY: How bad is the pain?  (Scale 0-10; or none or slight  stiffness, mild, moderate, severe)     mild 5. RADIATION: Does the pain go anywhere else, shoot into your arms?     No 6. CORD SYMPTOMS: Any weakness or numbness of the arms or legs?     No  7. CAUSE: What do you think is causing the neck pain?     Unsure 8. NECK OVERUSE: Any recent activities that involved turning or twisting the neck?     No 9. OTHER SYMPTOMS: Do you have any other symptoms? (e.g., headache, fever, chest pain, difficulty breathing, neck swelling)     nausea  Protocols used: Nausea-A-AH, Neck Pain or Stiffness-A-AH, Difficult Call-A-AH

## 2023-08-10 ENCOUNTER — Encounter: Payer: Self-pay | Admitting: Family Medicine

## 2023-08-10 ENCOUNTER — Ambulatory Visit (INDEPENDENT_AMBULATORY_CARE_PROVIDER_SITE_OTHER): Admitting: Family Medicine

## 2023-08-10 VITALS — BP 122/82 | HR 84 | Temp 98.3°F | Ht 62.5 in | Wt 186.4 lb

## 2023-08-10 DIAGNOSIS — R5383 Other fatigue: Secondary | ICD-10-CM

## 2023-08-10 DIAGNOSIS — M791 Myalgia, unspecified site: Secondary | ICD-10-CM

## 2023-08-10 DIAGNOSIS — R030 Elevated blood-pressure reading, without diagnosis of hypertension: Secondary | ICD-10-CM

## 2023-08-10 DIAGNOSIS — R110 Nausea: Secondary | ICD-10-CM | POA: Diagnosis not present

## 2023-08-10 DIAGNOSIS — Z20818 Contact with and (suspected) exposure to other bacterial communicable diseases: Secondary | ICD-10-CM | POA: Diagnosis not present

## 2023-08-10 DIAGNOSIS — R195 Other fecal abnormalities: Secondary | ICD-10-CM

## 2023-08-10 MED ORDER — AMOXICILLIN 875 MG PO TABS: 875.0000 mg | ORAL_TABLET | Freq: Two times a day (BID) | ORAL | 0 refills | Status: AC

## 2023-08-10 NOTE — Progress Notes (Signed)
 Established Patient Office Visit   Subjective  Patient ID: Jessica Lucero, female    DOB: 11-Apr-1995  Age: 28 y.o. MRN: [id number]  Chief Complaint  Patient presents with   Medical Management of Chronic Issues    nausea, listeria exposure, neck pain and Diarrhea, patient bought some [place] bacon from [employer] on July 4th and recived a phone call saying that the bacon contained listeria     Patient is a 28 year old female followed by Dr. Johnny and seen for acute concern.  Patient endorses eating [place] bacon on 08/03/2023.  4 days later patient developed achy, soreness/neck pain, and loose stools.  The next day patient developed nausea, decreased appetite, fatigue.  On 08/09/2023 patient received a phone call from the retail store [employer] to inform her the [place] bacon she purchased was recalled due to Listeria.  Patient was given the information for [employer] and advised the product she had was involved in the recall.  Patient unsure if symptoms were related to this or menses as she typically gets loose stools, abdominal discomfort/cramping a few days prior.  LMP 08/09/2023.  Patient unsure if had fever as taking Advil for cramps and muscle soreness.    Patient Active Problem List   Diagnosis Date Noted   Overweight 02/27/2022   Plantar warts 02/14/2022   Depression with anxiety 10/13/2019   Chronic neck pain 02/04/2019   Large breasts 10/23/2016   Acne vulgaris 01/20/2013   Migraines 01/20/2013   Enlarged lymph nodes 05/17/2007   Attention deficit hyperactivity disorder (ADHD) 09/07/2006   ECZEMA 09/07/2006   Past Medical History:  Diagnosis Date   ADHD (attention deficit hyperactivity disorder)    Frequent UTI    Past Surgical History:  Procedure Laterality Date   CYSTOSCOPY     Social History   Tobacco Use   Smoking status: Never   Smokeless tobacco: Never  Substance Use Topics   Alcohol use: No    Alcohol/week: 0.0 standard drinks of alcohol   Drug use: No   Family  History  Problem Relation Age of Onset   Depression Other    Anxiety disorder Other    No Known Allergies  ROS Negative unless stated above    Objective:     BP (!) 136/90 (BP Location: Left Arm, Patient Position: Sitting, Cuff Size: Normal)   Pulse 84   Temp 98.3 F (36.8 C) (Oral)   Ht 5' 2.5 (1.588 m)   Wt 186 lb 6.4 oz (84.6 kg)   LMP  (LMP Unknown)   SpO2 98%   BMI 33.55 kg/m  BP Readings from Last 3 Encounters:  08/10/23 (!) 136/90  02/27/22 130/80  12/14/20 122/78   Wt Readings from Last 3 Encounters:  08/10/23 186 lb 6.4 oz (84.6 kg)  02/27/22 183 lb 3.2 oz (83.1 kg)  12/14/20 139 lb 2 oz (63.1 kg)      Physical Exam Constitutional:      General: She is not in acute distress.    Appearance: Normal appearance.  HENT:     Head: Normocephalic and atraumatic.     Nose: Nose normal.     Mouth/Throat:     Mouth: Mucous membranes are moist.  Cardiovascular:     Rate and Rhythm: Normal rate and regular rhythm.     Heart sounds: Normal heart sounds. No murmur heard.    No gallop.  Pulmonary:     Effort: Pulmonary effort is normal. No respiratory distress.     Breath sounds: Normal  breath sounds. No wheezing, rhonchi or rales.  Abdominal:     General: Bowel sounds are increased.     Palpations: Abdomen is soft.     Tenderness: There is generalized abdominal tenderness.  Skin:    General: Skin is warm and dry.  Neurological:     Mental Status: She is alert and oriented to person, place, and time.        02/27/2022    2:02 PM 02/27/2022    2:00 PM 12/14/2020    5:30 PM  Depression screen PHQ 2/9  Decreased Interest   3  Down, Depressed, Hopeless   3  PHQ - 2 Score   6  Altered sleeping 3 3 1   Tired, decreased energy 3 3 3   Change in appetite 1 1 1   Feeling bad or failure about yourself  0 0 3  Trouble concentrating 1 1 3   Moving slowly or fidgety/restless 0 0 1  Suicidal thoughts 0 0 0  PHQ-9 Score   18  Difficult doing work/chores    Extremely dIfficult      02/27/2022    2:01 PM 02/27/2022    2:00 PM 12/14/2020    5:30 PM  GAD 7 : Generalized Anxiety Score  Nervous, Anxious, on Edge  1 3  Control/stop worrying   2  Worry too much - different things 3  3  Trouble relaxing 3  3  Restless 1  1  Easily annoyed or irritable 0  0  Afraid - awful might happen 0  0  Total GAD 7 Score   12  Anxiety Difficulty Very difficult  Not difficult at all     No results found for any visits on 08/10/23.    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  Dispense: 14 tablet; Refill: 0  Other fatigue  Myalgia  Nausea  Loose stools -     Amoxicillin ; Take 1 tablet (875 mg total) by mouth 2 (two) times daily for 7 days.  Dispense: 14 tablet; Refill: 0  White coat syndrome without diagnosis of hypertension  Patient with acute gastroenteritis possibly due to Listeria exposure from contaminated [place] bacon.  Also consider loose stools, nausea, abdominal discomfort caused by menses.  Obtain stool culture if patient able to give sample.  Discussed r/b/a of treatment options including observation.  Will start amoxicillin  875 mg twice daily x 7 days.  Given strict precautions.  Recheck BP.  Return if symptoms worsen or fail to improve.   Clotilda JONELLE Single, MD

## 2023-09-25 ENCOUNTER — Other Ambulatory Visit: Payer: Self-pay | Admitting: Family Medicine

## 2023-09-25 MED ORDER — AMPHETAMINE-DEXTROAMPHETAMINE 20 MG PO TABS: ORAL_TABLET | ORAL | 0 refills | Status: AC

## 2023-09-25 MED ORDER — AMPHETAMINE-DEXTROAMPHET ER 20 MG PO CP24: 20.0000 mg | ORAL_CAPSULE | ORAL | 0 refills | Status: AC

## 2023-09-25 NOTE — Telephone Encounter (Signed)
 Copied from CRM #[id number]. Topic: Clinical - Medication Refill >> Sep 25, 2023  9:27 AM Vena HERO wrote: Medication: amphetamine -dextroamphetamine  (ADDERALL) 20 MG tablet  Has the patient contacted their pharmacy? Yes (Agent: If no, request that the patient contact the pharmacy for the refill. If patient does not wish to contact the pharmacy document the reason why and proceed with request.) (Agent: If yes, when and what did the pharmacy advise?) Pharmacy stated to call provider  This is the patient's preferred pharmacy:   [id number] [place], [place] - [address] AT [place] [facility] [place] [address] [place] [address] Phone: [phone] Fax: [phone]  Is this the correct pharmacy for this prescription? Yes If no, delete pharmacy and type the correct one.   Has the prescription been filled recently? No  Is the patient out of the medication? Yes  Has the patient been seen for an appointment in the last year OR does the patient have an upcoming appointment? Yes  Can we respond through MyChart? Yes  Agent: Please be advised that Rx refills may take up to 3 business days. We ask that you follow-up with your pharmacy.

## 2023-09-25 NOTE — Telephone Encounter (Signed)
 Done
